# Patient Record
Sex: Male | Born: 2000 | Race: White | Hispanic: No | Marital: Single | Smoking: Former smoker
Health system: Southern US, Community
[De-identification: ages and names within clinical notes are randomized; demographics above are authoritative.]

## PROBLEM LIST (undated history)

## (undated) DIAGNOSIS — R519 Headache, unspecified: Secondary | ICD-10-CM

## (undated) DIAGNOSIS — S80819A Abrasion, unspecified lower leg, initial encounter: Secondary | ICD-10-CM

## (undated) DIAGNOSIS — G8929 Other chronic pain: Secondary | ICD-10-CM

## (undated) DIAGNOSIS — J45909 Unspecified asthma, uncomplicated: Secondary | ICD-10-CM

## (undated) DIAGNOSIS — J353 Hypertrophy of tonsils with hypertrophy of adenoids: Secondary | ICD-10-CM

## (undated) DIAGNOSIS — Z8489 Family history of other specified conditions: Secondary | ICD-10-CM

## (undated) DIAGNOSIS — R454 Irritability and anger: Secondary | ICD-10-CM

## (undated) DIAGNOSIS — F419 Anxiety disorder, unspecified: Secondary | ICD-10-CM

## (undated) DIAGNOSIS — F32A Depression, unspecified: Secondary | ICD-10-CM

## (undated) HISTORY — DX: Headache, unspecified: R51.9

## (undated) HISTORY — DX: Unspecified asthma, uncomplicated: J45.909

## (undated) HISTORY — PX: ESOPHAGOGASTRODUODENOSCOPY: SHX1529

## (undated) HISTORY — DX: Anxiety disorder, unspecified: F41.9

## (undated) HISTORY — DX: Depression, unspecified: F32.A

## (undated) HISTORY — DX: Other chronic pain: G89.29

## (undated) HISTORY — PX: FRENULECTOMY, LINGUAL: SHX1681

---
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
## 2022-01-23 ENCOUNTER — Other Ambulatory Visit: Payer: Self-pay | Admitting: Family Medicine

## 2022-01-25 ENCOUNTER — Ambulatory Visit: Payer: BC Managed Care – PPO | Admitting: Family Medicine

## 2022-01-26 NOTE — Telephone Encounter (Signed)
This medication is not covered It says use one of the preferred alternatives Unfortunately I have no idea what the preferred alternatives are  So if I am provided the preferred alternatives via a phone message I will be happy to act on this

## 2022-01-31 NOTE — Telephone Encounter (Signed)
Please inform mom that Zofran is no longer covered by his insurance they are recommending Phenergan but in my opinion Phenergan is way too strong.  Not quite sure what mom would like to do but Zofran will more than likely be out-of-pocket

## 2022-02-01 NOTE — Telephone Encounter (Signed)
Mom contacted and verbalized understanding. Mom states,if not to expensive, will pay out of pocket.

## 2022-02-14 ENCOUNTER — Encounter: Payer: Self-pay | Admitting: Gastroenterology

## 2022-02-14 ENCOUNTER — Encounter: Payer: Self-pay | Admitting: Family Medicine

## 2022-02-14 IMAGING — DX DG CHEST 2V
2 series · 2 of 2 positions shown · non-contrast
Comparison: 12/01/2011

CLINICAL DATA: Cough, fever, shortness of breath

EXAM:
CHEST - 2 VIEW

[chest pa]
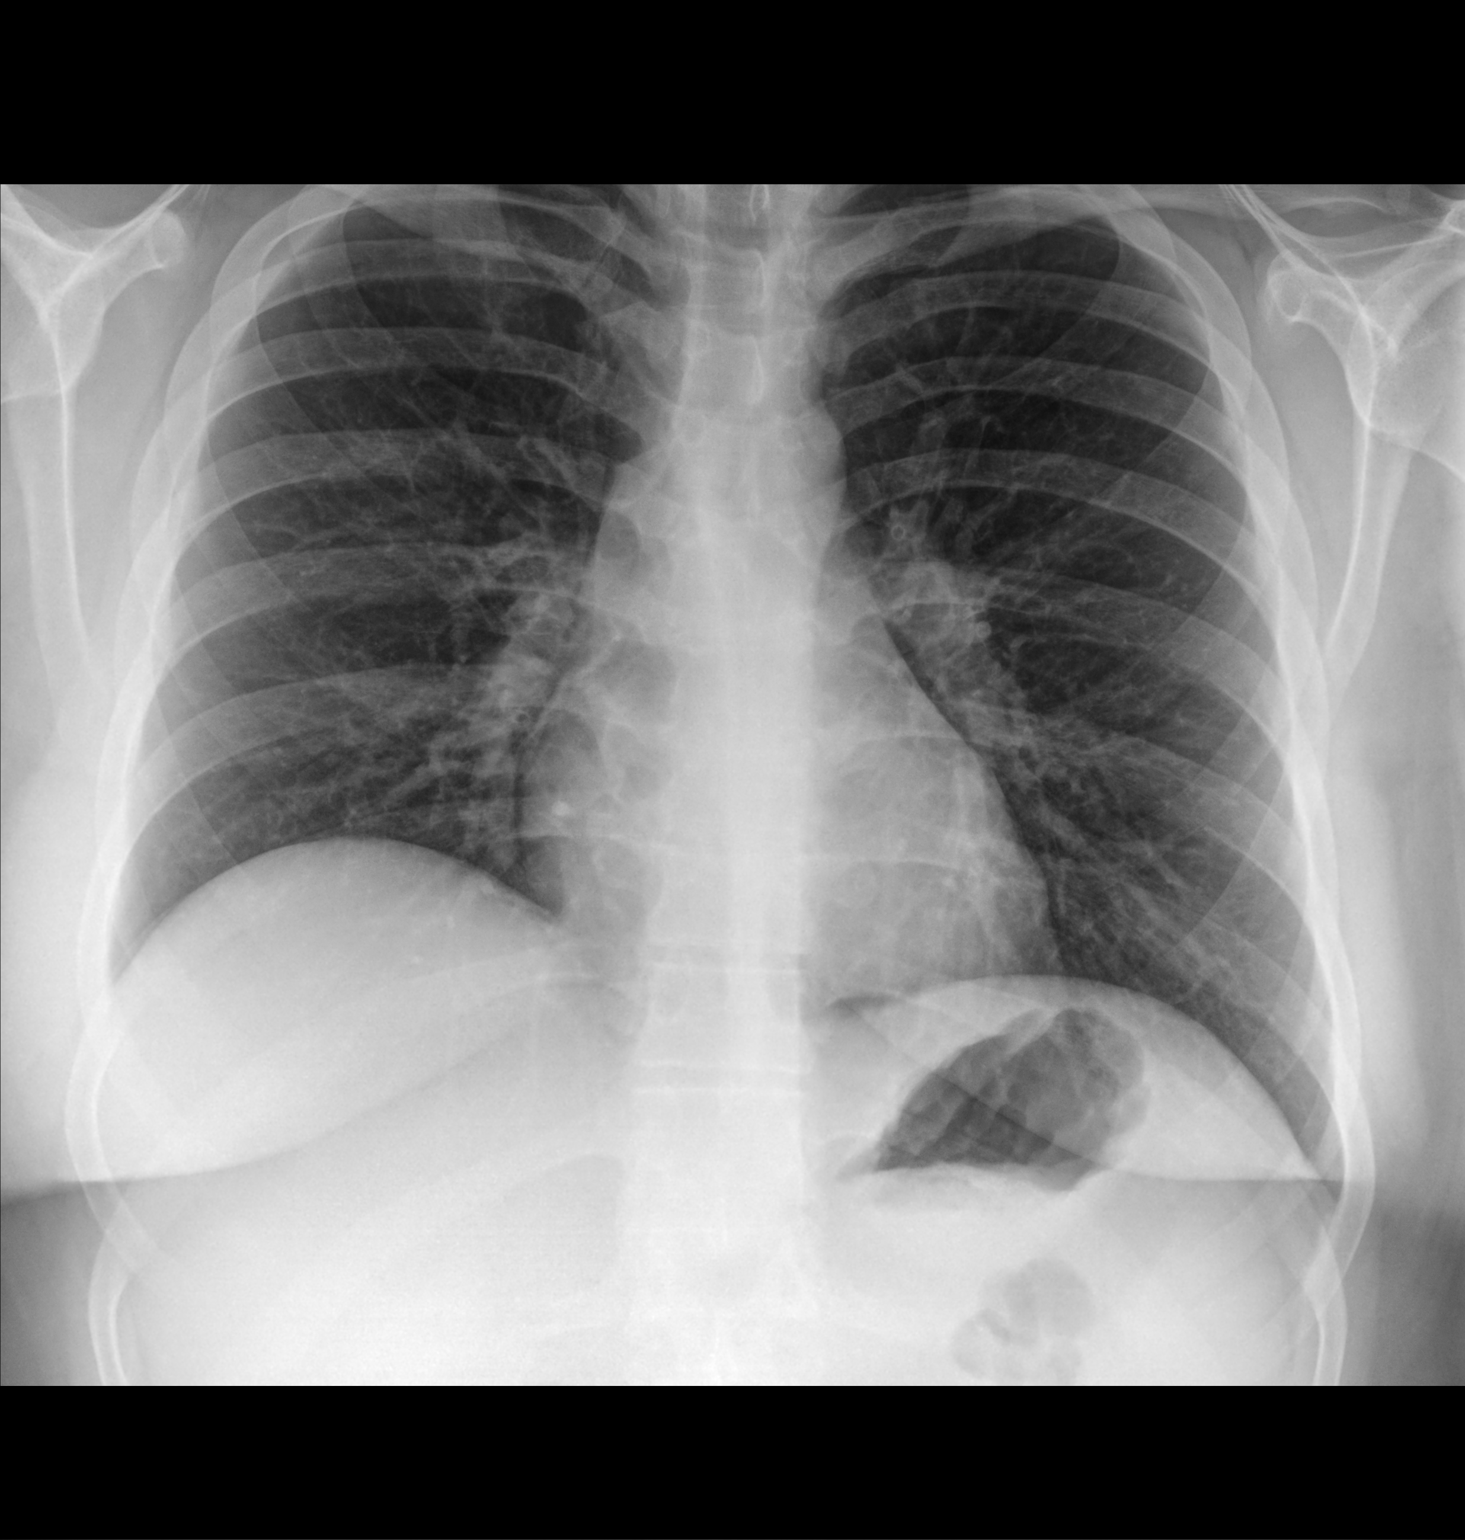

[chest lat]
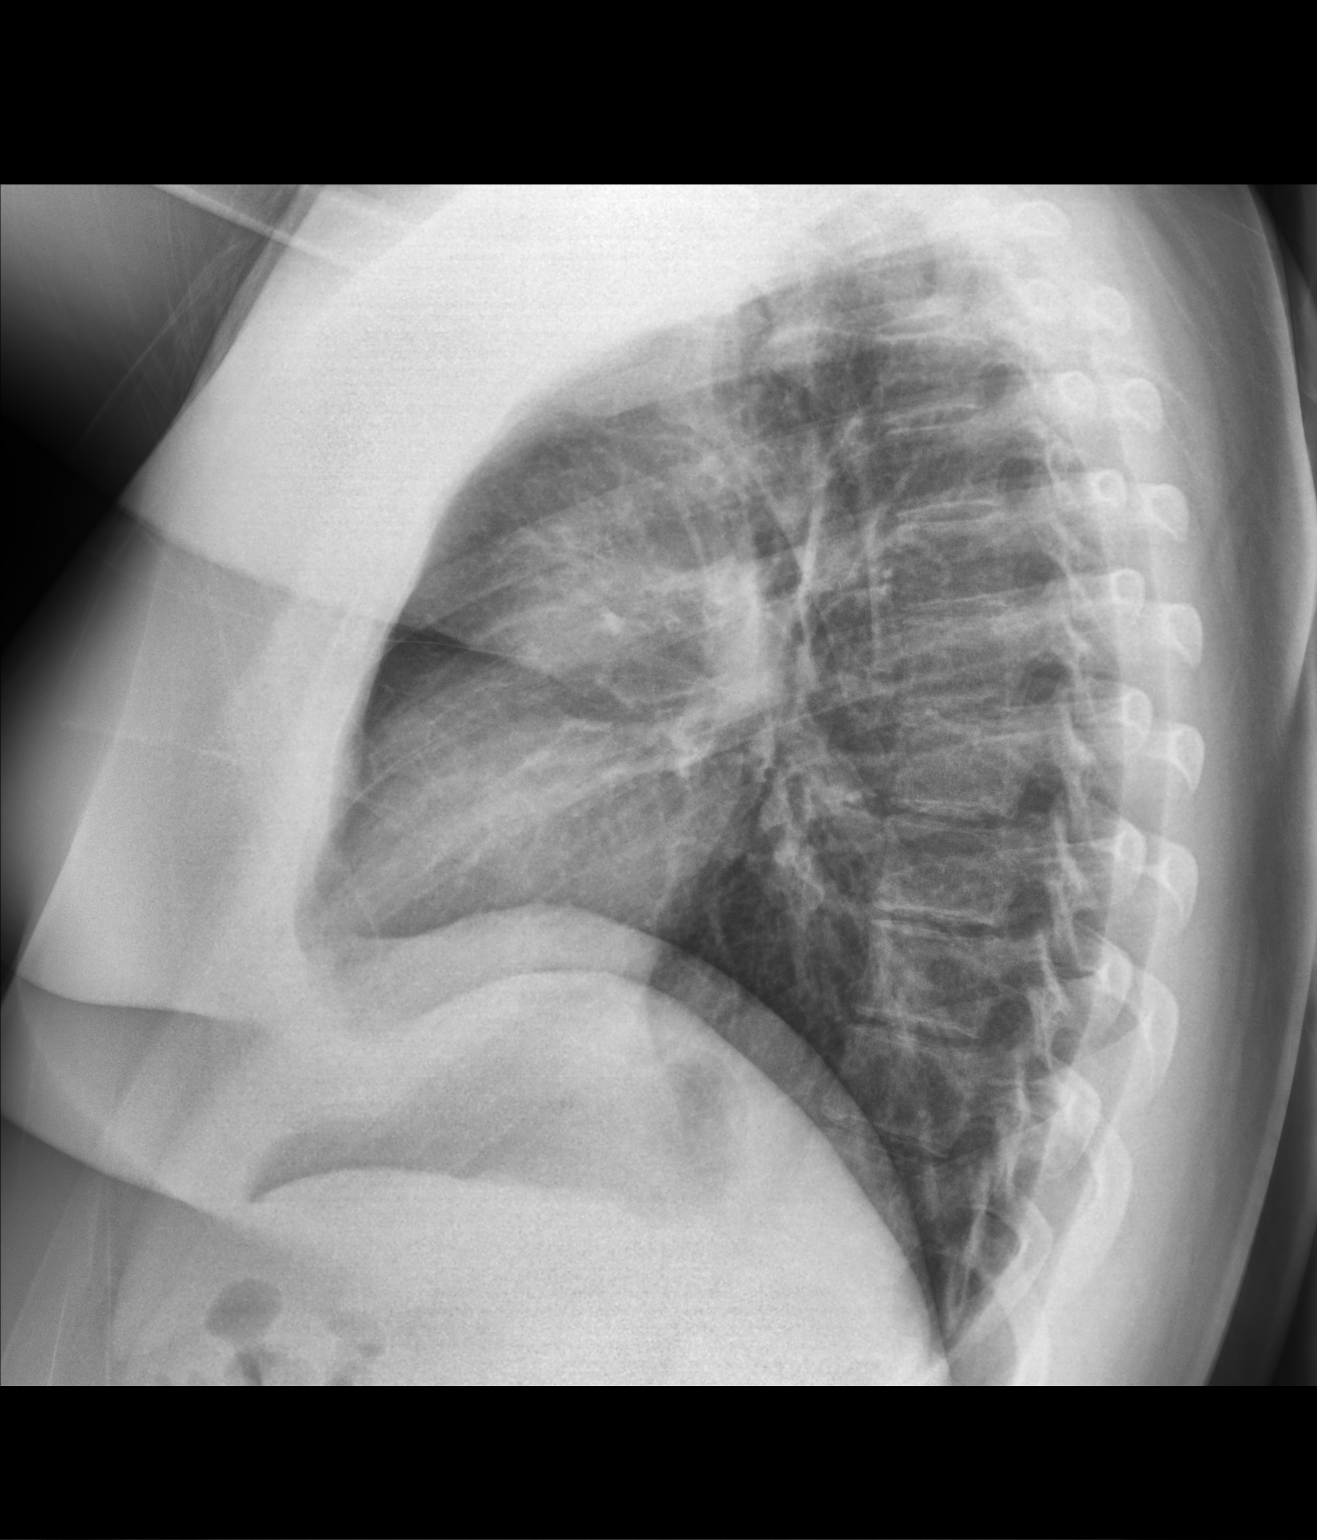

[2 of 2 positions shown; findings below may reference images not displayed]

FINDINGS: The heart size and mediastinal contours are within normal limits.
Both lungs are clear. The visualized skeletal structures are
unremarkable.
IMPRESSION: No acute abnormality of the lungs.

## 2022-02-15 NOTE — Telephone Encounter (Signed)
Nurses please go ahead and order pulmonary function testing due to dyspnea thank you

## 2022-02-22 ENCOUNTER — Encounter: Payer: Self-pay | Admitting: Gastroenterology

## 2022-02-22 ENCOUNTER — Ambulatory Visit: Payer: BC Managed Care – PPO | Admitting: Gastroenterology

## 2022-02-22 VITALS — BP 120/78 | HR 105 | Wt 224.4 lb

## 2022-02-22 DIAGNOSIS — R634 Abnormal weight loss: Secondary | ICD-10-CM | POA: Diagnosis not present

## 2022-02-22 DIAGNOSIS — R110 Nausea: Secondary | ICD-10-CM | POA: Diagnosis not present

## 2022-02-22 NOTE — Progress Notes (Unsigned)
Referring Provider: Babs Sciara, MD Primary Care Physician:  Babs Sciara, MD   Reason for Consultation:  Nausea   IMPRESSION:  Nausea Altered bowel habits  PLAN: Continue pantoprazole 40 mg QD Add famotidine 20 mg BID EGD Discussed MJ   HPI: Duane Fleming is a 21 y.o. male referred by Dr. Gerda Diss.  He has bad anxiety.   He lost 25 pounds over the month due to nausea, vomiting, and diffuse abdominal pain. Eating exacerbates his symptoms. He has lost his appetite.  He feels like can't urinate or defecate 10-12 months ago. Stools are soft. There is significant straining and a sense of incomplete evacuation.   Seen at [facility] for similar symptoms in high school. He was vomiting prior to school. He is doing online school for over a year.  Ultrasound showed a fatty liver. Liver enzymes were elevated. They recommended an endoscopy.  He smoking nicotene daily. Uses marijuana every hour to control his symptoms.   He started working out this week and he thinks that is helping.   His sister has EOE and colon ulcers and a fear of vomiting. She is followed by the pediatric gastroenterologist.   There is no known family history of colon cancer or polyps. No family history of stomach cancer or other GI malignancy. No family history of inflammatory bowel disease or celiac.    Past Medical History:  Diagnosis Date   Abrasion of leg 10/23/2016   Anxiety    Asthma    Chronic headache    Depression    Difficulty controlling anger    Family history of adverse reaction to anesthesia    maternal grandmother has issues with anxiety when waking from anesthesia   Tonsillar and adenoid hypertrophy 10/2016   mother unsure of apnea/snoring during sleep       Current Outpatient Medications  Medication Sig Dispense Refill   albuterol (VENTOLIN HFA) 108 (90 Base) MCG/ACT inhaler Inhale 2 puffs into the lungs every 6 (six) hours as needed for wheezing or shortness of breath. 18 g 1    carbamide peroxide (DEBROX) 6.5 % OTIC solution Place 5 drops into both ears 2 (two) times daily as needed. (Patient not taking: Reported on 12/14/2021) 15 mL 0   dicyclomine (BENTYL) 10 MG capsule Take one tid prn abdominal discomfort (Patient not taking: Reported on 12/14/2021) 21 capsule 1   levocetirizine (XYZAL) 5 MG tablet Take 1 tablet (5 mg total) by mouth every evening. (Patient not taking: Reported on 12/14/2021) 90 tablet 0   ondansetron (ZOFRAN) 8 MG tablet Take 0.5 tablets (4 mg total) by mouth every 8 (eight) hours as needed for nausea or vomiting (Patient not taking: Reported on 02/22/2022) 15 tablet 0   pantoprazole (PROTONIX) 40 MG tablet Take 1 tablet (40 mg total) by mouth daily. (Patient not taking: Reported on 02/22/2022) 30 tablet 3   No current facility-administered medications for this visit.    Allergies as of 02/22/2022 - Review Complete 02/22/2022  Allergen Reaction Noted   Keflex [cephalexin] Rash 12/15/2015    Family History  Problem Relation Age of Onset   Asthma Sister    Hypertension Maternal Grandfather    Hypertension Maternal Grandmother    Anesthesia problems Maternal Grandmother        anxiety when waking from anesthesia    Social History   Socioeconomic History   Marital status: Single    Spouse name: Not on file   Number of children: 0   Years  of education: Not on file   Highest education level: Not on file  Occupational History   Occupation: full time student  Tobacco Use   Smoking status: Never   Smokeless tobacco: Never  Vaping Use   Vaping Use: Never used  Substance and Sexual Activity   Alcohol use: No   Drug use: No   Sexual activity: Not on file  Other Topics Concern   Not on file  Social History Narrative   Not on file   Social Determinants of Health   Financial Resource Strain: Not on file  Food Insecurity: Not on file  Transportation Needs: Not on file  Physical Activity: Not on file  Stress: Not on file  Social  Connections: Not on file  Intimate Partner Violence: Not on file    Review of Systems: 12 system ROS is negative except as noted above.   Physical Exam: General:   Alert,  well-nourished, pleasant and cooperative in NAD Head:  Normocephalic and atraumatic. Eyes:  Sclera clear, no icterus.   Conjunctiva pink. Ears:  Normal auditory acuity. Nose:  No deformity, discharge,  or lesions. Mouth:  No deformity or lesions.   Neck:  Supple; no masses or thyromegaly. Lungs:  Clear throughout to auscultation.   No wheezes. Heart:  Regular rate and rhythm; no murmurs. Abdomen:  Soft, nontender, nondistended, normal bowel sounds, no rebound or guarding. No hepatosplenomegaly.   Rectal:  Deferred  Msk:  Symmetrical. No boney deformities LAD: No inguinal or umbilical LAD Extremities:  No clubbing or edema. Neurologic:  Alert and  oriented x4;  grossly nonfocal Skin:  Intact without significant lesions or rashes. Psych:  Alert and cooperative. Normal mood and affect.   Ivo Moga L. Orvan Falconer, MD, MPH 02/22/2022, 10:19 AM

## 2022-02-22 NOTE — Patient Instructions (Addendum)
We discussed an upper endoscopy for further evaluation.  I recommend pantoprazole 40 mg taken every morning for at least 8 weeks.   We discussed strategies to try to minimize your symptoms.  Avoid any foods that make you feel nauseated. This may include spicy, strong-smelling, and high fat foods. You might find cold, bland foods easier to eat without feeling nauseated.  Try using ginger products such as tea, ginger tablets, or ginger ale to help with the nausea.  Eat frequent, small, high calorie meals and snacks. Hunger can make the feelings of nausea stronger.  Sit upright or recline with head elevated for at least 30-60 minutes after meals.   Good oral hygiene with frequent tooth brushing can help reduce unpleasant mouth tastes contributing to nausea.  Apply a cool damp cloth on your neck or forehead if you are very nauseous.   Relaxation, imagery, acupressure, and acupuncture may all provide some relief. Keep an open mind and try them.    I'd like to see you back in the office after 8 weeks if you aren't going to have the endoscopy.

## 2022-02-23 ENCOUNTER — Ambulatory Visit (AMBULATORY_SURGERY_CENTER): Payer: BC Managed Care – PPO | Admitting: Gastroenterology

## 2022-02-23 ENCOUNTER — Encounter: Payer: Self-pay | Admitting: Gastroenterology

## 2022-02-23 VITALS — BP 109/62 | HR 75 | Temp 98.4°F | Resp 20 | Ht 71.0 in | Wt 224.0 lb

## 2022-02-23 DIAGNOSIS — R110 Nausea: Secondary | ICD-10-CM

## 2022-02-23 DIAGNOSIS — K2100 Gastro-esophageal reflux disease with esophagitis, without bleeding: Secondary | ICD-10-CM | POA: Diagnosis not present

## 2022-02-23 DIAGNOSIS — K2950 Unspecified chronic gastritis without bleeding: Secondary | ICD-10-CM

## 2022-02-23 DIAGNOSIS — R112 Nausea with vomiting, unspecified: Secondary | ICD-10-CM | POA: Diagnosis not present

## 2022-02-23 MED ORDER — SODIUM CHLORIDE 0.9 % IV SOLN: 500.0000 mL | Freq: Once | INTRAVENOUS | Status: DC

## 2022-02-23 NOTE — Patient Instructions (Signed)
Await pathology results from the biopsies taken today.  Resume previous diet and medications.  Continue pantoprazole 40 mg every morning and famotidine 20 mg twice a day.   YOU HAD AN ENDOSCOPIC PROCEDURE TODAY AT THE [facility]:   Refer to the procedure report that was given to you for any specific questions about what was found during the examination.  If the procedure report does not answer your questions, please call your gastroenterologist to clarify.  If you requested that your care partner not be given the details of your procedure findings, then the procedure report has been included in a sealed envelope for you to review at your convenience later.  YOU SHOULD EXPECT: Some feelings of bloating in the abdomen. Passage of more gas than usual.  Walking can help get rid of the air that was put into your GI tract during the procedure and reduce the bloating. If you had a lower endoscopy (such as a colonoscopy or flexible sigmoidoscopy) you may notice spotting of blood in your stool or on the toilet paper. If you underwent a bowel prep for your procedure, you may not have a normal bowel movement for a few days.  Please Note:  You might notice some irritation and congestion in your nose or some drainage.  This is from the oxygen used during your procedure.  There is no need for concern and it should clear up in a day or so.  SYMPTOMS TO REPORT IMMEDIATELY:   Following upper endoscopy (EGD)  Vomiting of blood or coffee ground material  New chest pain or pain under the shoulder blades  Painful or persistently difficult swallowing  New shortness of breath  Fever of 100F or higher  Black, tarry-looking stools  For urgent or emergent issues, a gastroenterologist can be reached at any hour by calling [phone]. Do not use MyChart messaging for urgent concerns.    DIET:  We do recommend a small meal at first, but then you may proceed to your regular diet.  Drink plenty of  fluids but you should avoid alcoholic beverages for 24 hours.  ACTIVITY:  You should plan to take it easy for the rest of today and you should NOT DRIVE or use heavy machinery until tomorrow (because of the sedation medicines used during the test).    FOLLOW UP: Our staff will call the number listed on your records the next business day following your procedure.  We will call around 7:15- 8:00 am to check on you and address any questions or concerns that you may have regarding the information given to you following your procedure. If we do not reach you, we will leave a message.     If any biopsies were taken you will be contacted by phone or by letter within the next 1-3 weeks.  Please call us at [phone] if you have not heard about the biopsies in 3 weeks.    SIGNATURES/CONFIDENTIALITY: You and/or your care partner have signed paperwork which will be entered into your electronic medical record.  These signatures attest to the fact that that the information above on your After Visit Summary has been reviewed and is understood.  Full responsibility of the confidentiality of this discharge information lies with you and/or your care-partner.

## 2022-02-23 NOTE — Progress Notes (Signed)
Indication for procedure: Nausea, vomiting, 25 pound weight loss, anorexia  Please see my 02/22/2022 office note for complete details.  There is been no significant change in history or physical exam since that time.  The patient remains an appropriate candidate for monitored anesthesia care in the endoscopy center.

## 2022-02-23 NOTE — Progress Notes (Signed)
Called to room to assist during endoscopic procedure.  Patient ID and intended procedure confirmed with present staff. Received instructions for my participation in the procedure from the performing physician.  

## 2022-02-23 NOTE — Op Note (Signed)
[facility] Patient Name: Duane Fleming Procedure Date: 02/23/2022 3:12 PM MRN: [id number] Endoscopist: Tressia Danas MD, MD, [id number] Age: 21 Referring MD:  Date of Birth: 06/15/2000 Gender: Male Account #: [id number] Procedure:                Upper GI endoscopy Indications:              Nausea with vomiting, 25 pound weight loss Medicines:                Monitored Anesthesia Care Procedure:                Pre-Anesthesia Assessment:                           - Prior to the procedure, a History and Physical                            was performed, and patient medications and                            allergies were reviewed. The patient's tolerance of                            previous anesthesia was also reviewed. The risks                            and benefits of the procedure and the sedation                            options and risks were discussed with the patient.                            All questions were answered, and informed consent                            was obtained. Prior Anticoagulants: The patient has                            taken no anticoagulant or antiplatelet agents. ASA                            Grade Assessment: II - A patient with mild systemic                            disease. After reviewing the risks and benefits,                            the patient was deemed in satisfactory condition to                            undergo the procedure.                           After obtaining informed consent, the endoscope was  passed under direct vision. Throughout the                            procedure, the patient's blood pressure, pulse, and                            oxygen saturations were monitored continuously. The                            GIF [id number] #[id number] was introduced through the                            mouth, and advanced to the third part of duodenum.                            The  upper GI endoscopy was accomplished without                            difficulty. The patient tolerated the procedure                            well. Scope In: Scope Out: Findings:                 The examined esophagus was normal. Biopsies were                            obtained from the mid/proximal and distal esophagus                            with cold forceps for histology of suspected                            eosinophilic esophagitis.                           Patchy minimal inflammation characterized by                            granularity was found in the gastric body. Biopsies                            were taken from the antrum, body, and fundus with a                            cold forceps for histology. Estimated blood loss                            was minimal.                           The examined duodenum was normal. Biopsies were                            taken with a cold forceps for histology. Estimated  blood loss was minimal.                           The cardia and gastric fundus were normal on                            retroflexion.                           The exam was otherwise without abnormality. Complications:            No immediate complications. Estimated Blood Loss:     Estimated blood loss was minimal. Impression:               - Normal esophagus.                           - Gastritis. Biopsied.                           - Normal examined duodenum. Biopsied.                           - The examination was otherwise normal.                           - Biopsies were taken with a cold forceps for                            evaluation of eosinophilic esophagitis. Recommendation:           - Patient has a contact number available for                            emergencies. The signs and symptoms of potential                            delayed complications were discussed with the                            patient.  Return to normal activities tomorrow.                            Written discharge instructions were provided to the                            patient.                           - Resume previous diet.                           - Continue present medications.                           - Continue pantoprazole 40 mg every morning and  famotidine 20 mg twice daily.                           - Await pathology results. Thornton Park MD, MD 02/23/2022 3:35:40 PM This report has been signed electronically.

## 2022-02-23 NOTE — Progress Notes (Signed)
Pt resting comfortably. VSS. Airway intact. SBAR complete to RN. All questions answered.   

## 2022-02-24 ENCOUNTER — Telehealth: Payer: Self-pay

## 2022-02-24 NOTE — Telephone Encounter (Signed)
  Follow up Call-     02/23/2022    2:32 PM  Call back number  Post procedure Call Back phone  # [phone]  Permission to leave phone message Yes     Patient questions:  Do you have a fever, pain , or abdominal swelling? No. Pain Score  0 *  Have you tolerated food without any problems? Yes.    Have you been able to return to your normal activities? Yes.    Do you have any questions about your discharge instructions: Diet   No. Medications  No. Follow up visit  No.  Do you have questions or concerns about your Care? No.  Actions: * If pain score is 4 or above: No action needed, pain <4.  States not eating yet but had issues before, will see how he does when he wakes up today and call if any concerns.

## 2022-03-01 ENCOUNTER — Other Ambulatory Visit: Payer: Self-pay

## 2022-03-01 MED ORDER — PANTOPRAZOLE SODIUM 40 MG PO TBEC: 40.0000 mg | DELAYED_RELEASE_TABLET | Freq: Every day | ORAL | 3 refills | Status: DC

## 2022-03-01 MED ORDER — PANTOPRAZOLE SODIUM 40 MG PO TBEC: 40.0000 mg | DELAYED_RELEASE_TABLET | Freq: Two times a day (BID) | ORAL | 1 refills | Status: DC

## 2022-03-25 ENCOUNTER — Other Ambulatory Visit: Payer: Self-pay | Admitting: Gastroenterology

## 2022-03-31 ENCOUNTER — Ambulatory Visit: Payer: BC Managed Care – PPO | Admitting: Physician Assistant

## 2022-04-12 ENCOUNTER — Ambulatory Visit: Payer: BC Managed Care – PPO | Admitting: Physician Assistant

## 2022-07-11 ENCOUNTER — Ambulatory Visit: Payer: No Typology Code available for payment source | Admitting: Family Medicine

## 2022-07-11 VITALS — BP 122/82 | HR 87 | Temp 99.1°F | Ht 71.5 in | Wt 202.0 lb

## 2022-07-11 DIAGNOSIS — H6122 Impacted cerumen, left ear: Secondary | ICD-10-CM

## 2022-07-11 MED ORDER — ONDANSETRON HCL 8 MG PO TABS: ORAL_TABLET | ORAL | 4 refills | Status: DC

## 2022-07-11 NOTE — Progress Notes (Signed)
   Subjective:    Patient ID: Duane Fleming, male    DOB: 2001-01-30, 22 y.o.   MRN: [id number]  HPI  Left ear throbbing pain, ringing, difficult hearing 2 to 3 days Request for zofran refill   Review of Systems     Objective:   Physical Exam Cerumen impaction noted on the left side no sign of infection       Assessment & Plan:   To use Colace drops in the ear to follow-up on Thursday to have the ear irrigated

## 2022-07-13 ENCOUNTER — Ambulatory Visit (INDEPENDENT_AMBULATORY_CARE_PROVIDER_SITE_OTHER): Payer: No Typology Code available for payment source | Admitting: Family Medicine

## 2022-07-13 DIAGNOSIS — H6122 Impacted cerumen, left ear: Secondary | ICD-10-CM | POA: Diagnosis not present

## 2022-07-13 MED ORDER — AMOXICILLIN-POT CLAVULANATE 875-125 MG PO TABS: 1.0000 | ORAL_TABLET | Freq: Two times a day (BID) | ORAL | 0 refills | Status: DC

## 2022-07-13 NOTE — Progress Notes (Signed)
Here today for ear irrigation Both ears were irrigated with good success He was also complaining of left ear pain On visual exam he does have a left otitis media He was treated with antibiotics and told to follow-up if any ongoing troubles

## 2022-08-14 ENCOUNTER — Other Ambulatory Visit: Payer: Self-pay

## 2022-08-14 ENCOUNTER — Ambulatory Visit (INDEPENDENT_AMBULATORY_CARE_PROVIDER_SITE_OTHER): Payer: No Typology Code available for payment source | Admitting: Family Medicine

## 2022-08-14 ENCOUNTER — Emergency Department (HOSPITAL_COMMUNITY): Payer: PRIVATE HEALTH INSURANCE

## 2022-08-14 ENCOUNTER — Emergency Department (HOSPITAL_COMMUNITY)
Admission: EM | Admit: 2022-08-14 | Discharge: 2022-08-14 | Disposition: A | Discharge status: Home / Self Care | Payer: PRIVATE HEALTH INSURANCE | Attending: Emergency Medicine | Admitting: Emergency Medicine

## 2022-08-14 ENCOUNTER — Encounter (HOSPITAL_COMMUNITY): Payer: Self-pay | Admitting: *Deleted

## 2022-08-14 VITALS — BP 132/91 | HR 110 | Temp 97.3°F | Ht 71.5 in | Wt 190.0 lb

## 2022-08-14 DIAGNOSIS — E876 Hypokalemia: Secondary | ICD-10-CM | POA: Insufficient documentation

## 2022-08-14 DIAGNOSIS — J45909 Unspecified asthma, uncomplicated: Secondary | ICD-10-CM | POA: Insufficient documentation

## 2022-08-14 DIAGNOSIS — R112 Nausea with vomiting, unspecified: Secondary | ICD-10-CM | POA: Insufficient documentation

## 2022-08-14 LAB — CBC WITH DIFFERENTIAL/PLATELET
Abs Immature Granulocytes: 0.02 10*3/uL (ref 0.00–0.07)
Basophils Absolute: 0 10*3/uL (ref 0.0–0.1)
Basophils Relative: 0 %
Eosinophils Absolute: 0 10*3/uL (ref 0.0–0.5)
Eosinophils Relative: 0 %
HCT: 46.7 % (ref 39.0–52.0)
Hemoglobin: 16.7 g/dL (ref 13.0–17.0)
Immature Granulocytes: 0 %
Lymphocytes Relative: 19 %
Lymphs Abs: 1.6 10*3/uL (ref 0.7–4.0)
MCH: 29.9 pg (ref 26.0–34.0)
MCHC: 35.8 g/dL (ref 30.0–36.0)
MCV: 83.5 fL (ref 80.0–100.0)
Monocytes Absolute: 0.4 10*3/uL (ref 0.1–1.0)
Monocytes Relative: 5 %
Neutro Abs: 6.2 10*3/uL (ref 1.7–7.7)
Neutrophils Relative %: 76 %
Platelets: 329 10*3/uL (ref 150–400)
RBC: 5.59 MIL/uL (ref 4.22–5.81)
RDW: 11.9 % (ref 11.5–15.5)
WBC: 8.3 10*3/uL (ref 4.0–10.5)
nRBC: 0 % (ref 0.0–0.2)

## 2022-08-14 LAB — COMPREHENSIVE METABOLIC PANEL
ALT: 31 U/L (ref 0–44)
AST: 20 U/L (ref 15–41)
Albumin: 5.2 g/dL — ABNORMAL HIGH (ref 3.5–5.0)
Alkaline Phosphatase: 60 U/L (ref 38–126)
Anion gap: 14 (ref 5–15)
BUN: 8 mg/dL (ref 6–20)
CO2: 22 mmol/L (ref 22–32)
Calcium: 10 mg/dL (ref 8.9–10.3)
Chloride: 103 mmol/L (ref 98–111)
Creatinine, Ser: 0.78 mg/dL (ref 0.61–1.24)
GFR, Estimated: >60 mL/min (ref >60)
Glucose, Bld: 87 mg/dL (ref 70–99)
Potassium: 3 mmol/L — ABNORMAL LOW (ref 3.5–5.1)
Sodium: 139 mmol/L (ref 135–145)
Total Bilirubin: 1.3 mg/dL — ABNORMAL HIGH (ref 0.3–1.2)
Total Protein: 8.5 g/dL — ABNORMAL HIGH (ref 6.5–8.1)

## 2022-08-14 LAB — URINALYSIS, ROUTINE W REFLEX MICROSCOPIC
Bacteria, UA: NONE SEEN
Bilirubin Urine: NEGATIVE
Glucose, UA: NEGATIVE mg/dL
Hgb urine dipstick: NEGATIVE
Ketones, ur: 80 mg/dL — AB
Leukocytes,Ua: NEGATIVE
Nitrite: NEGATIVE
Protein, ur: 30 mg/dL — AB
Specific Gravity, Urine: 1.023 (ref 1.005–1.030)
pH: 5 (ref 5.0–8.0)

## 2022-08-14 LAB — LIPASE, BLOOD: Lipase: 29 U/L (ref 11–51)

## 2022-08-14 MED ORDER — LORAZEPAM 0.5 MG PO TABS
0.5000 mg | ORAL_TABLET | Freq: Once | ORAL | Status: AC
  Administered 2022-08-14: 0.5 mg via ORAL
  Filled 2022-08-14: qty 1

## 2022-08-14 MED ORDER — POTASSIUM CHLORIDE CRYS ER 20 MEQ PO TBCR: 20.0000 meq | EXTENDED_RELEASE_TABLET | Freq: Two times a day (BID) | ORAL | 0 refills | Status: DC

## 2022-08-14 MED ORDER — ONDANSETRON HCL 4 MG/2ML IJ SOLN
4.0000 mg | Freq: Once | INTRAMUSCULAR | Status: AC
  Administered 2022-08-14: 4 mg via INTRAVENOUS
  Filled 2022-08-14: qty 2

## 2022-08-14 MED ORDER — FENTANYL CITRATE PF 50 MCG/ML IJ SOSY
25.0000 ug | PREFILLED_SYRINGE | Freq: Once | INTRAMUSCULAR | Status: AC
  Administered 2022-08-14: 25 ug via INTRAVENOUS
  Filled 2022-08-14: qty 1

## 2022-08-14 MED ORDER — POTASSIUM CHLORIDE 10 MEQ/100ML IV SOLN
10.0000 meq | INTRAVENOUS | Status: AC
  Administered 2022-08-14 (×2): 10 meq via INTRAVENOUS
  Filled 2022-08-14 (×2): qty 100

## 2022-08-14 MED ORDER — IOHEXOL 300 MG/ML  SOLN
100.0000 mL | Freq: Once | INTRAMUSCULAR | Status: AC | PRN
  Administered 2022-08-14: 100 mL via INTRAVENOUS

## 2022-08-14 MED ORDER — ONDANSETRON 8 MG PO TBDP: 8.0000 mg | ORAL_TABLET | Freq: Three times a day (TID) | ORAL | 3 refills | Status: DC | PRN

## 2022-08-14 MED ORDER — SODIUM CHLORIDE 0.9 % IV BOLUS
1000.0000 mL | Freq: Once | INTRAVENOUS | Status: AC
  Administered 2022-08-14: 1000 mL via INTRAVENOUS

## 2022-08-14 MED ORDER — ONDANSETRON HCL 4 MG PO TABS: 4.0000 mg | ORAL_TABLET | Freq: Four times a day (QID) | ORAL | 0 refills | Status: DC

## 2022-08-14 MED ORDER — HALOPERIDOL LACTATE 5 MG/ML IJ SOLN
1.0000 mg | Freq: Once | INTRAMUSCULAR | Status: AC
  Administered 2022-08-14: 1 mg via INTRAVENOUS
  Filled 2022-08-14: qty 1

## 2022-08-14 NOTE — Patient Instructions (Signed)
Go to the ER.  Schedule a follow up with Dr. Lorin Picket.  Take care  Dr. Adriana Simas

## 2022-08-14 NOTE — Assessment & Plan Note (Signed)
Patient heaving and vomiting throughout the exam.  Overall ill-appearing.  Advised the patient that he needs labs, IV fluids, and may need imaging of the abdomen.  I do not have the capability to provide IV fluids in the clinic.  We also do not have any injectable medication regarding nausea and vomiting.  Sending directly to the ED for labs, IV fluids, and imaging.  I have advised the patient that he should follow-up with Dr. Gerda Diss.  Patient asked me to refill his Zofran and I did so.

## 2022-08-14 NOTE — Progress Notes (Signed)
Subjective:  Patient ID: Duane Fleming, male    DOB: Apr 14, 2000  Age: 22 y.o. MRN: [id number]  CC: Chief Complaint  Patient presents with   Emesis    Nausea - hx of passing out 3/30  Severe stabbing ruq pain, temp 96.3 , feeling lightheaded , sweating, ,cold, lost 5 lbs in 5 days, trouble digesting, and watery stool- green/ brown , constipation, trouble peeing    HPI:  22 year old male with recurrent nausea and vomiting presents for evaluation of nausea and vomiting.  Patient has had symptoms for quite some time.  Per the EMR has had significant symptoms since May 2023.  Has been seen by GI as of last year.  Had an endoscopy which was essentially unrevealing.  Pathology revealed chronic esophagitis and gastritis as well as reflux changes.  No evidence of eosinophilic esophagitis.  Patient has had ongoing substantial weight loss which she states has been unintentional.  Over the past 5 days his symptoms have been intractable.  He has had intractable nausea and vomiting.  He has been able to keep some fluids down but nothing else.  He reports associated abdominal pain.  Patient feels lightheaded and as if he is going to pass out.  He feels shaky as well.  No documented fever.  He has been using Zofran and Protonix without relief.  When asked about marijuana use, patient states that he uses marijuana every day.  I have advised him that this could be the cause of his frequent nausea and vomiting.  He does not agree.  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   Obesity 11/26/2014   Asthma, chronic 02/17/2013    Social Hx   Social History   Socioeconomic History   Marital status: Single    Spouse name: Not on file   Number of children: 0   Years of education: Not on file   Highest education level: Not on file   Occupational History   Occupation: full time student  Tobacco Use   Smoking status: Never   Smokeless tobacco: Never  Vaping Use   Vaping Use: Every day  Substance and Sexual Activity   Alcohol use: No   Drug use: No   Sexual activity: Not on file  Other Topics Concern   Not on file  Social History Narrative   Not on file   Social Determinants of Health   Financial Resource Strain: Not on file  Food Insecurity: Not on file  Transportation Needs: Not on file  Physical Activity: Not on file  Stress: Not on file  Social Connections: Not on file    Review of Systems Per HPI  Objective:  BP (!) 132/91   Pulse (!) 110   Temp (!) 97.3 F (36.3 C)   Ht 5' 11.5" (1.816 m)   Wt 190 lb (86.2 kg)   SpO2 99%   BMI 26.13 kg/m      08/14/2022    9:30 PM 08/14/2022    9:00 PM 08/14/2022    8:33 PM  BP/Weight  Systolic BP 126 138 136  Diastolic BP 77 69 90    Physical Exam Constitutional:      Comments: Ill-appearing. Patient is shaking vigorously during exam.  Eyes:     General:        Right eye: No discharge.  Left eye: No discharge.     Conjunctiva/sclera: Conjunctivae normal.  Cardiovascular:     Rate and Rhythm: Regular rhythm. Tachycardia present.  Abdominal:     General: There is no distension.     Palpations: Abdomen is soft.     Comments: No significant tenderness to palpation on exam.  Neurological:     Mental Status: He is alert.     Lab Results  Component Value Date   WBC 8.3 08/14/2022   HGB 16.7 08/14/2022   HCT 46.7 08/14/2022   PLT 329 08/14/2022   GLUCOSE 87 08/14/2022   CHOL 204 (H) 02/05/2020   TRIG 117 (H) 02/05/2020   HDL 56 02/05/2020   LDLCALC 127 (H) 02/05/2020   ALT 31 08/14/2022   AST 20 08/14/2022   NA 139 08/14/2022   K 3.0 (L) 08/14/2022   CL 103 08/14/2022   CREATININE 0.78 08/14/2022   BUN 8 08/14/2022   CO2 22 08/14/2022   HGBA1C 5.1 02/05/2020     Assessment & Plan:   Problem List Items Addressed This  Visit       Digestive   Intractable nausea and vomiting - Primary    Patient heaving and vomiting throughout the exam.  Overall ill-appearing.  Advised the patient that he needs labs, IV fluids, and may need imaging of the abdomen.  I do not have the capability to provide IV fluids in the clinic.  We also do not have any injectable medication regarding nausea and vomiting.  Sending directly to the ED for labs, IV fluids, and imaging.  I have advised the patient that he should follow-up with Dr. Gerda Diss.  Patient asked me to refill his Zofran and I did so.       Meds ordered this encounter  Medications   ondansetron (ZOFRAN-ODT) 8 MG disintegrating tablet    Sig: Take 1 tablet (8 mg total) by mouth every 8 (eight) hours as needed for nausea or vomiting.    Dispense:  20 tablet    Refill:  3    Gurneet Matarese DO [facility]

## 2022-08-14 NOTE — ED Triage Notes (Addendum)
Pt with emesis x 5 days per pt. + abd pain LBM a week ago. Recurrent per family member. Pt seen at PCP and sent here for possible CT. Pt states he feels like he is going to pass out.

## 2022-08-14 NOTE — Discharge Instructions (Signed)
Your potassium level this evening was low.  You have being given potassium here as well as a prescription to take for several days.  Please follow-up with your primary care provider to have your potassium level rechecked in 1 week.  Frequent small sips of fluids tonight, bland diet as tolerated starting tomorrow.  Return to the emergency department for any new or worsening symptoms

## 2022-08-14 NOTE — ED Provider Notes (Signed)
[place] EMERGENCY DEPARTMENT AT [facility] Provider Note   CSN: [id number] Arrival date & time: 08/14/22  1624     History  Chief Complaint  Patient presents with   Emesis    Dail A Strosnider is a 22 y.o. male.   Emesis Associated symptoms: abdominal pain   Associated symptoms: no arthralgias, no chills, no fever and no headaches       Sipriano A Collin is a 22 y.o. male with past medical history of anxiety, chronic headaches, and asthma who presents to the Emergency Department complaining of persistent nausea and vomiting x 5 days.  Describes having of diffuse abdominal pain as well.  Last significant bowel movement was 1 week ago but states he passed a small amount of stool earlier today.  Reports feeling dizzy and "feels like I am going to pass out."  Seen by PCP earlier today and sent here for further evaluation.  Denies any fever, chills, dysuria chest pain or shortness of breath.    Home Medications Prior to Admission medications   Medication Sig Start Date End Date Taking? Authorizing Provider  albuterol (VENTOLIN HFA) 108 (90 Base) MCG/ACT inhaler Inhale 2 puffs into the lungs every 6 (six) hours as needed for wheezing or shortness of breath. Patient not taking: Reported on 08/14/2022 02/23/21   Mardella Layman, MD  levocetirizine (XYZAL) 5 MG tablet Take 1 tablet (5 mg total) by mouth every evening. Patient not taking: Reported on 08/14/2022 05/01/21   Wallis Bamberg, PA-C  ondansetron (ZOFRAN-ODT) 8 MG disintegrating tablet Take 1 tablet (8 mg total) by mouth every 8 (eight) hours as needed for nausea or vomiting. 08/14/22   Tommie Sams, DO  pantoprazole (PROTONIX) 40 MG tablet TAKE 1 TABLET (40 MG TOTAL) BY MOUTH TWICE A DAY BEFORE MEALS Patient not taking: Reported on 08/14/2022 03/27/22   Tressia Danas, MD      Allergies    Keflex [cephalexin]    Review of Systems   Review of Systems  Constitutional:  Positive for appetite change. Negative for chills and  fever.  HENT:  Negative for congestion.   Respiratory:  Negative for chest tightness and shortness of breath.   Cardiovascular:  Negative for chest pain.  Gastrointestinal:  Positive for abdominal pain, constipation, nausea and vomiting.  Genitourinary:  Negative for difficulty urinating and dysuria.  Musculoskeletal:  Negative for arthralgias, back pain, neck pain and neck stiffness.  Neurological:  Positive for weakness. Negative for dizziness, seizures, syncope and headaches.       "Feels like I'm going to pass out"    Physical Exam Updated Vital Signs BP 126/77   Pulse 60   Temp 98.3 F (36.8 C) (Oral)   Resp 17   SpO2 97%  Physical Exam Vitals and nursing note reviewed.  Constitutional:      General: He is not in acute distress.    Appearance: Normal appearance. He is not ill-appearing.  HENT:     Mouth/Throat:     Mouth: Mucous membranes are dry.  Cardiovascular:     Rate and Rhythm: Normal rate and regular rhythm.     Pulses: Normal pulses.  Pulmonary:     Effort: Pulmonary effort is normal.  Abdominal:     Palpations: Abdomen is soft.     Tenderness: There is abdominal tenderness. There is no right CVA tenderness or left CVA tenderness.     Comments: Diffuse tenderness to palpation of the abdomen.  No guarding or rebound tenderness.  Abdomen is soft  Musculoskeletal:        General: Normal range of motion.     Cervical back: Normal range of motion.  Lymphadenopathy:     Cervical: No cervical adenopathy.  Skin:    General: Skin is warm.     Capillary Refill: Capillary refill takes less than 2 seconds.  Neurological:     General: No focal deficit present.     Mental Status: He is alert.     Sensory: No sensory deficit.     Motor: No weakness.     ED Results / Procedures / Treatments   Labs (all labs ordered are listed, but only abnormal results are displayed) Labs Reviewed  COMPREHENSIVE METABOLIC PANEL - Abnormal; Notable for the following components:       Result Value   Potassium 3.0 (*)    Total Protein 8.5 (*)    Albumin 5.2 (*)    Total Bilirubin 1.3 (*)    All other components within normal limits  URINALYSIS, ROUTINE W REFLEX MICROSCOPIC - Abnormal; Notable for the following components:   Ketones, ur 80 (*)    Protein, ur 30 (*)    All other components within normal limits  CBC WITH DIFFERENTIAL/PLATELET  LIPASE, BLOOD    EKG None  Radiology CT ABDOMEN PELVIS W CONTRAST  Result Date: 08/14/2022 CLINICAL DATA:  Abdominal pain, acute, nonlocalized . Nausea, vomiting EXAM: CT ABDOMEN AND PELVIS WITH CONTRAST TECHNIQUE: Multidetector CT imaging of the abdomen and pelvis was performed using the standard protocol following bolus administration of intravenous contrast. RADIATION DOSE REDUCTION: This exam was performed according to the departmental dose-optimization program which includes automated exposure control, adjustment of the mA and/or kV according to patient size and/or use of iterative reconstruction technique. CONTRAST:  OMNIPAQUE IOHEXOL 300 MG/ML  SOLN COMPARISON:  None Available. FINDINGS: Lower chest: No acute abnormality. Hepatobiliary: No focal liver abnormality is seen. No gallstones, gallbladder wall thickening, or biliary dilatation. Pancreas: Unremarkable Spleen: Unremarkable Adrenals/Urinary Tract: Adrenal glands are unremarkable. Kidneys are normal, without renal calculi, focal lesion, or hydronephrosis. Bladder is unremarkable. Stomach/Bowel: Stomach is within normal limits. Appendix appears normal. No evidence of bowel wall thickening, distention, or inflammatory changes. Vascular/Lymphatic: No significant vascular findings are present. No enlarged abdominal or pelvic lymph nodes. Reproductive: Prostate is unremarkable. Other: Tiny fat containing right inguinal hernia Musculoskeletal: No acute or significant osseous findings. IMPRESSION: 1. No acute intra-abdominal pathology identified. No definite radiographic  explanation for the patient's reported symptoms. 2. Tiny fat containing right inguinal hernia. Electronically Signed   By: Helyn Numbers M.D.   On: 08/14/2022 19:33    Procedures Procedures    Medications Ordered in ED Medications  potassium chloride 10 mEq in 100 mL IVPB (10 mEq Intravenous New Bag/Given 08/14/22 2132)  sodium chloride 0.9 % bolus 1,000 mL (0 mLs Intravenous Stopped 08/14/22 2026)  ondansetron (ZOFRAN) injection 4 mg (4 mg Intravenous Given 08/14/22 1816)  fentaNYL (SUBLIMAZE) injection 25 mcg (25 mcg Intravenous Given 08/14/22 1817)  LORazepam (ATIVAN) tablet 0.5 mg (0.5 mg Oral Given 08/14/22 1915)  iohexol (OMNIPAQUE) 300 MG/ML solution 100 mL (100 mLs Intravenous Contrast Given 08/14/22 1926)  sodium chloride 0.9 % bolus 1,000 mL (1,000 mLs Intravenous New Bag/Given 08/14/22 2037)  haloperidol lactate (HALDOL) injection 1 mg (1 mg Intravenous Given 08/14/22 2032)    ED Course/ Medical Decision Making/ A&P  Medical Decision Making Patient here for evaluation of persistent nausea vomiting with abdominal pain.  States he is unable to keep down solid foods and continuously "feels like I am about to pass out."   Differential would include but not limited to acute appendicitis, SBO, constipation, gastroenteritis, other viral process, cardiac process considered but felt less likely as he has GI symptoms of vomiting and clinically appears dehydrated.  He is PERC negative.  Amount and/or Complexity of Data Reviewed Labs: ordered.    Details: Labs interpreted by me, no evidence of leukocytosis, hemoglobin unremarkable.  Chemistries show hypokalemia with potassium of 3.  Lipase unremarkable, urinalysis shows ketones and proteinuria without evidence of infection or hematuria Radiology: ordered.    Details: CT abdomen and pelvis ordered for further evaluation of patient's symptoms, appendix appears normal, no acute intra-abdominal findings. Discussion of  management or test interpretation with external provider(s): On recheck, patient has received IV fluids, IV potassium and medication for nausea.  He reports feeling much better and states he is wanting to go home.  Denies any further feeling like he is going to pass out.  The cause of his nausea vomiting unclear at this time, possibly viral.  CT was reassuring.  hypokalemia was addressed here felt to be secondary to his persistent vomiting.   I feel he is appropriate for discharge home with close outpatient follow-up with his PCP.  Prescription will be written for potassium and for Zofran.  He will follow-up with his PCP to have his potassium rechecked in 1 week.  Risk Prescription drug management.           Final Clinical Impression(s) / ED Diagnoses Final diagnoses:  Hypokalemia  Nausea and vomiting, unspecified vomiting type    Rx / DC Orders ED Discharge Orders     None         Rosey Bath 08/14/22 2329    Gloris Manchester, MD 08/17/22 0009

## 2022-08-15 ENCOUNTER — Telehealth: Payer: Self-pay

## 2022-08-15 NOTE — Transitions of Care (Post Inpatient/ED Visit) (Signed)
   08/15/2022  Name: Duane Fleming MRN: [id number] DOB: [birth year]  Today's TOC FU Call Status: Today's TOC FU Call Status:: Successful TOC FU Call Competed TOC FU Call Complete Date: 08/15/22  Transition Care Management Follow-up Telephone Call Date of Discharge: 08/14/22 [name] Type of Discharge: Emergency Department Reason for ED Visit: Other: (hypokalemia) How have you been since you were released from the hospital?: Same Any questions or concerns?: No  Items Reviewed: Did you receive and understand the discharge instructions provided?: Yes Medications obtained,verified, and reconciled?: Yes (Medications Reviewed) Any new allergies since your discharge?: No Dietary orders reviewed?: Yes Do you have support at home?: Yes People in Home: parent(s)  Medications Reviewed Today: Medications Reviewed Today     Reviewed by Duane Addison, LPN (Licensed Practical Nurse) on 08/15/22 at 1453  Med List Status: <None>   Medication Order Taking? Sig Documenting Provider Last Dose Status Informant  albuterol (VENTOLIN HFA) 108 (90 Base) MCG/ACT inhaler [id number] No Inhale 2 puffs into the lungs every 6 (six) hours as needed for wheezing or shortness of breath.  Patient not taking: Reported on 08/14/2022   Duane Layman, MD Not Taking Active   levocetirizine (XYZAL) 5 MG tablet [id number] No Take 1 tablet (5 mg total) by mouth every evening.  Patient not taking: Reported on 08/14/2022   Duane Bamberg, PA-C Not Taking Active   ondansetron [facility]) 4 MG tablet [id number] Yes Take 1 tablet (4 mg total) by mouth every 6 (six) hours. As needed for nausea vomiting Triplett, Tammy, PA-C Taking Active   ondansetron (ZOFRAN-ODT) 8 MG disintegrating tablet [id number] Yes Take 1 tablet (8 mg total) by mouth every 8 (eight) hours as needed for nausea or vomiting. Duane Other G, DO Taking Active   pantoprazole (PROTONIX) 40 MG tablet [id number] No TAKE 1 TABLET (40 MG TOTAL) BY  MOUTH TWICE A DAY BEFORE MEALS  Patient not taking: Reported on 08/14/2022   Duane Danas, MD Not Taking Active   potassium chloride SA (KLOR-CON M) 20 MEQ tablet [id number] Yes Take 1 tablet (20 mEq total) by mouth 2 (two) times daily. Duane Aus, PA-C Taking Active             Home Care and Equipment/Supplies: Were Home Health Services Ordered?: NA Any new equipment or medical supplies ordered?: NA  Functional Questionnaire: Do you need assistance with bathing/showering or dressing?: No Do you need assistance with meal preparation?: No Do you need assistance with eating?: No Do you have difficulty maintaining continence: No Do you need assistance with getting out of bed/getting out of a chair/moving?: No Do you have difficulty managing or taking your medications?: No  Follow up appointments reviewed: PCP Follow-up appointment confirmed?: Yes Date of PCP follow-up appointment?: 08/17/22 Follow-up Provider: Dr Gerda [facility] Follow-up appointment confirmed?: NA Do you need transportation to your follow-up appointment?: No Do you understand care options if your condition(s) worsen?: Yes-patient verbalized understanding    SIGNATURE Duane Addison, LPN [facility] Nurse Health Advisor Direct Dial [phone]

## 2022-08-17 ENCOUNTER — Ambulatory Visit: Payer: No Typology Code available for payment source | Admitting: Family Medicine

## 2022-08-17 VITALS — BP 130/88 | HR 79 | Wt 190.8 lb

## 2022-08-17 DIAGNOSIS — R112 Nausea with vomiting, unspecified: Secondary | ICD-10-CM | POA: Diagnosis not present

## 2022-08-17 DIAGNOSIS — F1290 Cannabis use, unspecified, uncomplicated: Secondary | ICD-10-CM | POA: Diagnosis not present

## 2022-08-17 DIAGNOSIS — F419 Anxiety disorder, unspecified: Secondary | ICD-10-CM | POA: Diagnosis not present

## 2022-08-17 LAB — POCT URINALYSIS DIP (CLINITEK)
Bilirubin, UA: NEGATIVE
Blood, UA: NEGATIVE
Glucose, UA: NEGATIVE mg/dL
Ketones, POC UA: NEGATIVE mg/dL
Leukocytes, UA: NEGATIVE
Nitrite, UA: NEGATIVE
POC PROTEIN,UA: NEGATIVE
Spec Grav, UA: <=1.005 — AB (ref 1.010–1.025)
Urobilinogen, UA: NEGATIVE E.U./dL — AB
pH, UA: 6 (ref 5.0–8.0)

## 2022-08-17 MED ORDER — SUCRALFATE 1 GM/10ML PO SUSP: 1.0000 g | Freq: Three times a day (TID) | ORAL | 0 refills | Status: DC

## 2022-08-17 NOTE — Progress Notes (Signed)
   Subjective:    Patient ID: Duane Fleming, male    DOB: 07-29-2000, 22 y.o.   MRN: [id number]  HPI Patient unfortunately is having severe abdominal discomforts not feeling good with frequent nausea and vomiting and feels nauseous all the time He has had a recent CAT scan and lab work which did not show any obvious surgical issues He complains of epigastric tenderness Can only take sips of liquid frequently throughout the day but is able to urinate [place] Patient denies projectile vomiting He does admit to marijuana use but he states he is cutting back He was smoking very frequently because of underlying anxiety and depression Not suicidal Patient does not want to see a counselor or psychiatrist Recently had low potassium Received IV fluids  Patient arrives today for hospital follow up. Patient states he feels like he is going to pass out.   Review of Systems     Objective:   Physical Exam General-in no acute distress Eyes-no discharge Lungs-respiratory rate normal, CTA CV-no murmurs,RRR Extremities skin warm dry no edema Neuro grossly normal Behavior normal, alert  Patient is clammy but blood pressure stable does not drop with standing Patient feels awful feels like he is going to pass out but does not pass out Patient is taking PPI we will add Carafate Patient is taking his potassium we will check stat labs in the morning he does not want to go today Vital signs laying and sitting stable Previous EGD November 2023 negative for cancer negative for eosinophilia positive for gastritis    Assessment & Plan:   1. Nausea and vomiting, unspecified vomiting type Patient with intermittent epigastric pain discomfort will need to do lab work. Doubt bleeding ulcer but we will add Carafate to see if this helps Continue Zofran  - POCT URINALYSIS DIP (CLINITEK) - Basic metabolic panel - Hepatic function panel - Amylase - CBC with Differential/Platelet  2. Cannabinoid  hyperemesis syndrome Patient encouraged to strongly minimize marijuana use ideally he will cut it out altogether but at the minimum cut it down to maybe a couple times a week currently he smokes every single day  3. Anxiety Counseling would be beneficial  Stat labs tomorrow morning with results based on this may well need other intervention Once we get the lab work back we will consider other measures May benefit from head scan May benefit from workup for Addison's disease Still could be related to hyperemesis from marijuana use but need to make sure we are not overlooking other issues

## 2022-08-18 ENCOUNTER — Encounter: Payer: Self-pay | Admitting: Family Medicine

## 2022-08-18 ENCOUNTER — Other Ambulatory Visit (HOSPITAL_COMMUNITY)
Admission: RE | Admit: 2022-08-18 | Discharge: 2022-08-18 | Disposition: A | Discharge status: Home / Self Care | Payer: No Typology Code available for payment source | Source: Ambulatory Visit | Attending: Family Medicine | Admitting: Family Medicine

## 2022-08-18 ENCOUNTER — Telehealth: Payer: Self-pay | Admitting: Family Medicine

## 2022-08-18 DIAGNOSIS — R112 Nausea with vomiting, unspecified: Secondary | ICD-10-CM | POA: Diagnosis present

## 2022-08-18 LAB — CBC WITH DIFFERENTIAL/PLATELET
Abs Immature Granulocytes: 0.01 10*3/uL (ref 0.00–0.07)
Basophils Absolute: 0 10*3/uL (ref 0.0–0.1)
Basophils Relative: 0 %
Eosinophils Absolute: 0 10*3/uL (ref 0.0–0.5)
Eosinophils Relative: 1 %
HCT: 46.3 % (ref 39.0–52.0)
Hemoglobin: 16.4 g/dL (ref 13.0–17.0)
Immature Granulocytes: 0 %
Lymphocytes Relative: 23 %
Lymphs Abs: 1.4 10*3/uL (ref 0.7–4.0)
MCH: 29.8 pg (ref 26.0–34.0)
MCHC: 35.4 g/dL (ref 30.0–36.0)
MCV: 84 fL (ref 80.0–100.0)
Monocytes Absolute: 0.3 10*3/uL (ref 0.1–1.0)
Monocytes Relative: 5 %
Neutro Abs: 4.3 10*3/uL (ref 1.7–7.7)
Neutrophils Relative %: 71 %
Platelets: 309 10*3/uL (ref 150–400)
RBC: 5.51 MIL/uL (ref 4.22–5.81)
RDW: 12.3 % (ref 11.5–15.5)
WBC: 6 10*3/uL (ref 4.0–10.5)
nRBC: 0 % (ref 0.0–0.2)

## 2022-08-18 LAB — BASIC METABOLIC PANEL
Anion gap: 11 (ref 5–15)
BUN: 7 mg/dL (ref 6–20)
CO2: 26 mmol/L (ref 22–32)
Calcium: 10.1 mg/dL (ref 8.9–10.3)
Chloride: 103 mmol/L (ref 98–111)
Creatinine, Ser: 0.82 mg/dL (ref 0.61–1.24)
GFR, Estimated: >60 mL/min (ref >60)
Glucose, Bld: 95 mg/dL (ref 70–99)
Potassium: 3.6 mmol/L (ref 3.5–5.1)
Sodium: 140 mmol/L (ref 135–145)

## 2022-08-18 LAB — HEPATIC FUNCTION PANEL
ALT: 24 U/L (ref 0–44)
AST: 15 U/L (ref 15–41)
Albumin: 5 g/dL (ref 3.5–5.0)
Alkaline Phosphatase: 59 U/L (ref 38–126)
Bilirubin, Direct: 0.1 mg/dL (ref 0.0–0.2)
Indirect Bilirubin: 0.9 mg/dL (ref 0.3–0.9)
Total Bilirubin: 1 mg/dL (ref 0.3–1.2)
Total Protein: 8.3 g/dL — ABNORMAL HIGH (ref 6.5–8.1)

## 2022-08-18 LAB — AMYLASE: Amylase: 57 U/L (ref 28–100)

## 2022-08-18 NOTE — Telephone Encounter (Signed)
Unable to reach by phone:   Results and provider recommendations  Seen by patient Duane Fleming on 08/18/2022 10:24 AM via my chart

## 2022-08-18 NOTE — Telephone Encounter (Signed)
Please see documentation on his result note please make sure that patient or his mother are aware of the result note thank you

## 2022-08-21 ENCOUNTER — Encounter: Payer: Self-pay | Admitting: Family Medicine

## 2022-08-21 NOTE — Telephone Encounter (Signed)
Nurses Patient recently seen Carafate was added Lab work had looked good Previous CAT scan which was done 7 days ago looked good At the time I saw him he was having more nausea with frequent vomiting and low potassium he was not complaining of right lower quadrant abdominal pain His mom raises the concern of right lower quadrant abdominal pain I would recommend a follow-up visit this week Also recommend urgent consultation with gastroenterology It is her option if she would like for him to be seen with [facility] or with local gastroenterology Either way I doubt they are going to see him this week because most of the specialist office are backed up several weeks Also very important for him to stay away from marijuana as we discussed which has been very difficult for him to follow through on in the past We will discuss further testing when he does his follow-up visit You may share a copy of this with Mikael and his mother via MyChart

## 2022-08-23 ENCOUNTER — Ambulatory Visit: Payer: No Typology Code available for payment source | Admitting: Family Medicine

## 2022-08-23 VITALS — BP 126/87

## 2022-08-23 DIAGNOSIS — R1011 Right upper quadrant pain: Secondary | ICD-10-CM

## 2022-08-23 DIAGNOSIS — R112 Nausea with vomiting, unspecified: Secondary | ICD-10-CM

## 2022-08-23 MED ORDER — PROMETHAZINE HCL 25 MG PO TABS: ORAL_TABLET | ORAL | 2 refills | Status: DC

## 2022-08-23 MED ORDER — DICYCLOMINE HCL 10 MG PO CAPS: ORAL_CAPSULE | ORAL | 1 refills | Status: DC

## 2022-08-23 NOTE — Progress Notes (Unsigned)
   Subjective:    Patient ID: Duane Fleming, male    DOB: July 16, 2000, 22 y.o.   MRN: [id number]  HPI Challenging situation He has ongoing abdominal discomfort This is been something that dates back into last year when he has spells of severe nausea and often will have intermittent vomiting difficulty eating and weight loss feeling fatigued sweaty and tired He has been seen numerous times here Previously he was seen by Dr. Orvan Falconer with [facility] gastroenterology Because of this transition they are desiring to get established in [place] since they live here  In the past he admits only had smoked marijuana multiple times a day he suffers with significant anxiety issues in the past has done counseling but the counseling did not seem to help a lot.  He is also tried medications including antidepressants and it did not help him.  He still smokes marijuana in the evening but states he is cut tremendously down and only uses some sort of thank which is a delta thank and the evening time.  I informed him it would be best to try to get away from all of that to see if that would help with his situation.  In the past when he was utilizing marijuana multiple times a day I told him that his situation of hyperemesis was due to the marijuana.  Unfortunately even with cutting back he is still experiencing symptoms.  Admittedly it could still be related to that but we need to make sure that there is not something more going on   Patient arrives for a follow up on abdominal pain. Patient still not able to eat or keep anything down and weak and dizzy  Review of Systems     Objective:   Physical Exam General-in no acute distress Eyes-no discharge Lungs-respiratory rate normal, CTA CV-no murmurs,RRR Extremities skin warm dry no edema Neuro grossly normal Behavior normal, alert        Assessment & Plan:  1. Nausea and vomiting, unspecified vomiting type Phenergan as needed Zofran as needed -  CMP14+EGFR - US Abdomen Limited RUQ (LIVER/GB) - CBC with Differential/Platelet - Lipase - Cortisol - COMPLETE METABOLIC PANEL WITH eGFR  2. RUQ abdominal pain Stat ultrasound stat lab work - CMP14+EGFR - US Abdomen Limited RUQ (LIVER/GB) - CBC with Differential/Platelet - Lipase - Cortisol - COMPLETE METABOLIC PANEL WITH eGFR  3. Intractable nausea and vomiting May need HIDA may need gastric emptying study may need EGD will do GI consultation his regular gastroenterologist left the practice we will try him to get him established locally - CMP14+EGFR - US Abdomen Limited RUQ (LIVER/GB) - CBC with Differential/Platelet - Lipase - Cortisol - COMPLETE METABOLIC PANEL WITH eGFR  Patient has cut back to marijuana to just once or twice per day compared to where it was  Please see discussion above  We are trying to be systematic with the approach to this may end up having to do gastric emptying study may end up having to do CT scan but for start with ultrasound then potentially HIDA Continue Carafate along with Phenergan along with PPI   we will also try to get consultation with gastroenterology

## 2022-08-24 ENCOUNTER — Ambulatory Visit (HOSPITAL_COMMUNITY)
Admission: RE | Admit: 2022-08-24 | Discharge: 2022-08-24 | Disposition: A | Discharge status: Home / Self Care | Payer: No Typology Code available for payment source | Source: Ambulatory Visit | Attending: Family Medicine | Admitting: Family Medicine

## 2022-08-24 DIAGNOSIS — R112 Nausea with vomiting, unspecified: Secondary | ICD-10-CM | POA: Insufficient documentation

## 2022-08-24 DIAGNOSIS — R1011 Right upper quadrant pain: Secondary | ICD-10-CM | POA: Insufficient documentation

## 2022-08-25 ENCOUNTER — Other Ambulatory Visit (HOSPITAL_COMMUNITY)
Admission: RE | Admit: 2022-08-25 | Discharge: 2022-08-25 | Disposition: A | Discharge status: Home / Self Care | Payer: No Typology Code available for payment source | Source: Ambulatory Visit | Attending: Family Medicine | Admitting: Family Medicine

## 2022-08-25 ENCOUNTER — Other Ambulatory Visit: Payer: Self-pay

## 2022-08-25 ENCOUNTER — Telehealth: Payer: Self-pay | Admitting: Family Medicine

## 2022-08-25 DIAGNOSIS — R112 Nausea with vomiting, unspecified: Secondary | ICD-10-CM | POA: Insufficient documentation

## 2022-08-25 DIAGNOSIS — R1011 Right upper quadrant pain: Secondary | ICD-10-CM

## 2022-08-25 LAB — CBC WITH DIFFERENTIAL/PLATELET
Abs Immature Granulocytes: 0.02 10*3/uL (ref 0.00–0.07)
Basophils Absolute: 0 10*3/uL (ref 0.0–0.1)
Basophils Relative: 0 %
Eosinophils Absolute: 0.2 10*3/uL (ref 0.0–0.5)
Eosinophils Relative: 3 %
HCT: 46.7 % (ref 39.0–52.0)
Hemoglobin: 16.4 g/dL (ref 13.0–17.0)
Immature Granulocytes: 0 %
Lymphocytes Relative: 26 %
Lymphs Abs: 1.4 10*3/uL (ref 0.7–4.0)
MCH: 29.8 pg (ref 26.0–34.0)
MCHC: 35.1 g/dL (ref 30.0–36.0)
MCV: 84.8 fL (ref 80.0–100.0)
Monocytes Absolute: 0.4 10*3/uL (ref 0.1–1.0)
Monocytes Relative: 7 %
Neutro Abs: 3.6 10*3/uL (ref 1.7–7.7)
Neutrophils Relative %: 64 %
Platelets: 308 10*3/uL (ref 150–400)
RBC: 5.51 MIL/uL (ref 4.22–5.81)
RDW: 12.4 % (ref 11.5–15.5)
WBC: 5.6 10*3/uL (ref 4.0–10.5)
nRBC: 0 % (ref 0.0–0.2)

## 2022-08-25 LAB — COMPREHENSIVE METABOLIC PANEL
ALT: 29 U/L (ref 0–44)
AST: 19 U/L (ref 15–41)
Albumin: 4.9 g/dL (ref 3.5–5.0)
Alkaline Phosphatase: 57 U/L (ref 38–126)
Anion gap: 11 (ref 5–15)
BUN: 7 mg/dL (ref 6–20)
CO2: 26 mmol/L (ref 22–32)
Calcium: 9.8 mg/dL (ref 8.9–10.3)
Chloride: 102 mmol/L (ref 98–111)
Creatinine, Ser: 0.72 mg/dL (ref 0.61–1.24)
GFR, Estimated: >60 mL/min (ref >60)
Glucose, Bld: 101 mg/dL — ABNORMAL HIGH (ref 70–99)
Potassium: 3.6 mmol/L (ref 3.5–5.1)
Sodium: 139 mmol/L (ref 135–145)
Total Bilirubin: 0.9 mg/dL (ref 0.3–1.2)
Total Protein: 8 g/dL (ref 6.5–8.1)

## 2022-08-25 LAB — CORTISOL: Cortisol, Plasma: 21.9 ug/dL

## 2022-08-25 LAB — LIPASE, BLOOD: Lipase: 29 U/L (ref 11–51)

## 2022-08-25 NOTE — Telephone Encounter (Signed)
Nurses Please inform family the lab work look good-cortisol pending Please go ahead with initiating HIDA order due to right upper quadrant pain and persistent nausea and vomiting Result note posted Copy of  below  Electrolytes look good, liver enzymes look good, CBC looks good, ultrasound came back looking good but we will be pursuing setting up a HIDA test to look at gallbladder function

## 2022-08-25 NOTE — Telephone Encounter (Signed)
Spoke with patient's mom , Marylene [place] on [place], she is aware and saw everything on mychart. She is letting you know that he does have hemorrhoids and some rectal bleeding. Using OTC preparation H- please advise.

## 2022-08-26 NOTE — Telephone Encounter (Signed)
Unfortunately there is nothing specific that really helps hemorrhoids other than warm compresses, may use Preparation H, keep bowel movements soft use MiraLAX if necessary to keep, soft

## 2022-08-28 NOTE — Telephone Encounter (Signed)
Patient's mom contacted and informed per drs notes.

## 2022-08-29 ENCOUNTER — Telehealth: Payer: Self-pay | Admitting: Family Medicine

## 2022-08-29 ENCOUNTER — Encounter: Payer: Self-pay | Admitting: *Deleted

## 2022-08-29 ENCOUNTER — Telehealth: Payer: Self-pay | Admitting: Internal Medicine

## 2022-08-29 DIAGNOSIS — R112 Nausea with vomiting, unspecified: Secondary | ICD-10-CM

## 2022-08-29 NOTE — Telephone Encounter (Signed)
Patient's PCP reached out to me in regards to this patient.  Can we see if we can get him in for new patient evaluation in 1 to 2 weeks with either myself or an app? Dx Nausea/vomiting  **Previously seen by [facility] though his gastroenterologist left and patient wishes to establish care in [place]**  Thank you!

## 2022-08-29 NOTE — Telephone Encounter (Signed)
Referral ordered in EPIC. My chart message sent to notify patient.

## 2022-08-29 NOTE — Telephone Encounter (Signed)
Nurses Go ahead with referral to Dr. Verl Bangs gastroenterology  Reason for referral-intractable nausea and vomiting  I have communicated with Dr. Marletta Lor who stated he would agree to take the patient on  please have referral coordinator send the referral  Please send MyChart message to patient letting him know referral has been initiated keep all follow-up visits with [place] as well  Thank you

## 2022-08-30 ENCOUNTER — Other Ambulatory Visit: Payer: Self-pay | Admitting: Family Medicine

## 2022-08-30 ENCOUNTER — Telehealth: Payer: Self-pay | Admitting: Family Medicine

## 2022-08-30 MED ORDER — HYDROCORTISONE (PERIANAL) 2.5 % EX CREA: 1.0000 | TOPICAL_CREAM | Freq: Two times a day (BID) | CUTANEOUS | 3 refills | Status: DC

## 2022-08-30 NOTE — Telephone Encounter (Signed)
Mom called states patients Hemorrhoids are really hurting him. She would like to know if Proctosolm could be called in for him? Patient uses [facility].

## 2022-08-30 NOTE — Telephone Encounter (Signed)
Left message and my chart message sent

## 2022-08-30 NOTE — Telephone Encounter (Signed)
Proctosol cream was sent in May apply twice daily as needed Warm sitz bath several times per day Keep bowel movements soft with stool softeners If ongoing troubles may need to see gastroenterology or general surgery

## 2022-09-05 ENCOUNTER — Ambulatory Visit: Payer: No Typology Code available for payment source | Admitting: Family Medicine

## 2022-09-05 VITALS — BP 134/83 | HR 87 | Wt 190.2 lb

## 2022-09-05 DIAGNOSIS — R1011 Right upper quadrant pain: Secondary | ICD-10-CM

## 2022-09-05 DIAGNOSIS — R112 Nausea with vomiting, unspecified: Secondary | ICD-10-CM | POA: Diagnosis not present

## 2022-09-05 DIAGNOSIS — F322 Major depressive disorder, single episode, severe without psychotic features: Secondary | ICD-10-CM

## 2022-09-05 MED ORDER — FLUOXETINE HCL 20 MG PO TABS
20.0000 mg | ORAL_TABLET | Freq: Every day | ORAL | 3 refills | Status: DC
Start: 2022-09-05 — End: 2022-10-02

## 2022-09-05 MED ORDER — PROMETHAZINE HCL 25 MG PO TABS: ORAL_TABLET | ORAL | 2 refills | Status: DC

## 2022-09-05 NOTE — Progress Notes (Addendum)
   Subjective:    Patient ID: Duane Fleming, male    DOB: 10-06-2000, 22 y.o.   MRN: [id number]  HPI Patient arrives today with nausea and vomiting abdominal pain.  Patient with significant nausea and vomiting just not feeling well has HIDA test coming up tomorrow has had previous scans as well as lab work and ultrasound Phenergan currently does help him  He relates a lot of chronic anxiety and depression finds himself feeling down feels like his situation is hopeless at times at times he does thinking about hurting himself but at the same time does not have an active plan.  When questioned how he would hurt himself he states that he would shoot himself but he also states that he does not have a gun.  He also states what keeps him from doing this is does not want his mom to find him that way and he would not want to disappoint his mom.  He states he is not actively considering suicide currently.  Previously patient stated that he did not want to have any medication or counseling but now he is open to doing so.  Patient states no changes in regards to the nausea and he finds himself able to drink liquids and then along goldfish but not able to eat any substantial food.     Review of Systems     Objective:   Physical Exam General-in no acute distress Eyes-no discharge Lungs-respiratory rate normal, CTA CV-no murmurs,RRR Extremities skin warm dry no edema Neuro grossly normal Behavior normal, alert Abdomen soft with moderate tenderness right upper quadrant mid abdomen       Assessment & Plan:  1. Nausea and vomiting, unspecified vomiting type Continue Phenergan as needed Patient currently able to drink and nibble on goldfish Long term needs further workup with GI we will try to get him in  2. RUQ abdominal pain HIDA tomorrow await results  3. Depression, major, single episode, severe (HCC) Patient with significant anxiety and depression he agrees to see counselor his mom is  trying to get him in with a counselor she sees but he does need to see psychiatry Start Prozac Suicidal ideation but no specific plan and currently states he is not going to harm himself We will recheck him again in 1 week's time Emergency plan for suicide discussed with family - Ambulatory referral to Psychiatry We will also go forward with urgent psychiatry consultation Message has been sent to the referral coordinator to help work together toward trying to get him some help relatively soon  Patient was warned that if the Prozac makes him feel more depressed or more suicidal ideation stop medicine immediately and reach out for help here or the ER

## 2022-09-06 ENCOUNTER — Encounter (HOSPITAL_COMMUNITY): Payer: No Typology Code available for payment source

## 2022-09-11 ENCOUNTER — Telehealth (INDEPENDENT_AMBULATORY_CARE_PROVIDER_SITE_OTHER): Payer: No Typology Code available for payment source | Admitting: Family Medicine

## 2022-09-11 DIAGNOSIS — F1290 Cannabis use, unspecified, uncomplicated: Secondary | ICD-10-CM | POA: Diagnosis not present

## 2022-09-11 DIAGNOSIS — R112 Nausea with vomiting, unspecified: Secondary | ICD-10-CM

## 2022-09-11 DIAGNOSIS — F32A Depression, unspecified: Secondary | ICD-10-CM

## 2022-09-11 DIAGNOSIS — F322 Major depressive disorder, single episode, severe without psychotic features: Secondary | ICD-10-CM

## 2022-09-11 DIAGNOSIS — F411 Generalized anxiety disorder: Secondary | ICD-10-CM

## 2022-09-11 NOTE — Progress Notes (Signed)
Subjective:    Patient ID: Duane Fleming, male    DOB: Apr 04, 2001, 22 y.o.   MRN: [id number]  HPI varicose veinsMr. Daddario,you are scheduled for a virtual visit with your provider today.    Just as we do with appointments in the office, we must obtain your consent to participate.  Your consent will be active for this visit and any virtual visit you may have with one of our providers in the next 365 days.    If you have a MyChart account, I can also send a copy of this consent to you electronically.  All virtual visits are billed to your insurance company just like a traditional visit in the office.  As this is a virtual visit, video technology does not allow for your provider to perform a traditional examination.  This may limit your provider's ability to fully assess your condition.  If your provider identifies any concerns that need to be evaluated in person or the need to arrange testing such as labs, EKG, etc, we will make arrangements to do so.    Although advances in technology are sophisticated, we cannot ensure that it will always work on either your end or our end.  If the connection with a video visit is poor, we may have to switch to a telephone visit.  With either a video or telephone visit, we are not always able to ensure that we have a secure connection.   I need to obtain your verbal consent now.   Are you willing to proceed with your visit today?   [name] has provided verbal consent on 09/11/2022 for a virtual visit (video or telephone).  Virtual Visit via Video Note  I connected with Duane Fleming on 09/11/22 at  3:10 PM EDT by a video enabled telemedicine application and verified that I am speaking with the correct person using two identifiers.  Location: Patient: Home Provider: Office   I discussed the limitations of evaluation and management by telemedicine and the availability of in person appointments. The patient expressed understanding and agreed to  proceed.  History of Present Illness:    Observations/Objective:   Assessment and Plan:   Follow Up Instructions:    I discussed the assessment and treatment plan with the patient. The patient was provided an opportunity to ask questions and all were answered. The patient agreed with the plan and demonstrated an understanding of the instructions.   The patient was advised to call back or seek an in-person evaluation if the symptoms worsen or if the condition fails to improve as anticipated.  I provided 15 minutes of non-face-to-face time during this encounter.   Duane Punt, MD   Patient's phone number at home [phone]       Review of Systems     Objective:   Physical Exam Patient had virtual visit-video Appears to be in no distress Atraumatic Neuro able to relate and oriented No apparent resp distress Color normal Video visit Mother present during video visit        Assessment & Plan:  Patient seen gastroenterology tomorrow Patient was unable to do HIDA test because of panic attack Patient is able to tolerate goldfish and lunch troubles with the current medicines Patient states he is cut way back on marijuana  Major depression not currently suicidal on Prozac 20 mg tolerating well has counseling full session later this week has visit with psychiatry next week family will keep [place] updated  I plan to  call the patient tomorrow late afternoon to get update on how his visit with gastroenterology And to talk with him further regarding his mental health

## 2022-09-12 ENCOUNTER — Encounter: Payer: Self-pay | Admitting: Gastroenterology

## 2022-09-12 ENCOUNTER — Ambulatory Visit (INDEPENDENT_AMBULATORY_CARE_PROVIDER_SITE_OTHER): Payer: No Typology Code available for payment source | Admitting: Gastroenterology

## 2022-09-12 VITALS — BP 116/67 | HR 93 | Temp 98.9°F | Ht 71.0 in | Wt 195.8 lb

## 2022-09-12 DIAGNOSIS — R634 Abnormal weight loss: Secondary | ICD-10-CM | POA: Diagnosis not present

## 2022-09-12 DIAGNOSIS — K5900 Constipation, unspecified: Secondary | ICD-10-CM

## 2022-09-12 DIAGNOSIS — R110 Nausea: Secondary | ICD-10-CM

## 2022-09-12 DIAGNOSIS — K644 Residual hemorrhoidal skin tags: Secondary | ICD-10-CM

## 2022-09-12 DIAGNOSIS — R1011 Right upper quadrant pain: Secondary | ICD-10-CM | POA: Diagnosis not present

## 2022-09-12 MED ORDER — METOCLOPRAMIDE HCL 5 MG PO TABS
5.0000 mg | ORAL_TABLET | Freq: Three times a day (TID) | ORAL | 0 refills | Status: DC
Start: 2022-09-12 — End: 2022-10-16

## 2022-09-12 NOTE — Progress Notes (Unsigned)
GI Office Note    Referring Provider: Babs Sciara, MD Primary Care Physician:  Babs Sciara, MD  Primary Gastroenterologist: Hennie Duos. Marletta Lor, DO  Chief Complaint   Chief Complaint  Patient presents with   Abdominal Pain    Referred for abdominal pain, nausea and vomiting. Started about 4 -5 weeks ago. Takes zofran and phenergan. Has not been eating much. Mostly goldfish crackers, gatoraide and sprite. Mother reports 60 lbs weight loss in past year.    Hemorrhoids    Having some pain with hemorrhoids. Tried tuck pads and a steroid cream from pcp and not getting better. Having a hard time to have BM. Having to strain. Has passed out 04-01-24and EMS was called.    History of Present Illness   Duane Fleming is a 22 y.o. male presenting today at the request of Luking, Jonna Coup, MD for nausea/vomiting.   Prior workup for fatty liver in 2021 which included elevated liver enzymes but with normal ANA, ceruloplasmin, ferritin, negative for hepatitis B and hepatitis A.  A1c also normal.  Lipid panel did show elevated cholesterol as well as triglycerides.  Endoscopy recommended at that time as well but patient decided to not pursue.  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  Previously seen by Dr. Orvan Falconer at [facility].  Last office visit 02/22/2022.  Reported a 25 pound weight loss with associated anorexia.  Also having intermittent diarrhea on top of nausea and vomiting.  Impression from previous visit reported negative serologic workup for celiac and H. pylori negative.  Broad differentials including reflux, gastritis, and cannabis hyperemesis syndrome.  Also advised to the manifestation of anxiety.  Reported frequent marijuana use for many years.  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  Ultrasound in 2021 showing fatty liver.  Advise  effective treatment for anxiety may ultimately improve his GI symptoms.  Plan was for pantoprazole 40 mg daily and famotidine 20 mg twice daily.  Advised to schedule EGD.  Discussed marijuana as possible cause of symptoms and recommended trial 4 weeks without smoking to see if symptoms improve.  Advised to consider cross-sectional imaging and EGD and biopsies nondiagnostic.  He previously expressed that he did not feel as though marijuana was the issue given he was smoking for many years and his symptoms have began more recently prior to his visit.  Not interested in counseling or medication for stress, anxiety, or depression.  No known family history of colon cancer or polyps or any other GI malignancy.  Sister does have EOE and food allergies and currently undergoing evaluation for IBD.  Prior serologic workup: Negative H. pylori antibody, negative celiac serologies.  Normal CRP, ESR 17.   Liver labs normal including mono, ANA, LK M antibody, ASMA, IgG.  EGD 02/23/2022: -Examined esophagus normal s/p biopsy -Patchy minimal inflammation characterized by granularity found in the gastric body s/p biopsy -Normal examined duodenum -Cardia and gastric fundus normal -Advised to continue pantoprazole 40 mg every morning and famotidine 20 mg twice daily. -Path: Mild chronic gastritis, stains negative for H. pylori.  Benign small bowel mucosa.  Esophageal changes with mild reflux changes.  No EOE.  CT A/P 08/14/2022: -No acute intra-abdominal pathology identified -Tiny fat-containing right inguinal hernia  RUQ [place] 08/24/2022: -CBD 3.4 mm -Normal parenchymal echogenicity -Patent Doppler  Seen by PCP 09/05/2022: Given right upper quadrant pain HIDA was ordered.  Appears patient was unable to tolerate this due to anxiety.  His anxiety and depression was addressed at this visit and he was advised to start Prozac and monitor for suicidal ideation.  Also given referral to psychiatry.-Taking Zofran and Phenergan  as needed for nausea and vomiting.  Today:  About every 6 months he is having days of N/V and unable to get good intake. This last time he passed out and was having small green pieces of stools - usually complains of constipation and then hard for him to pee and have a BM and feels like stomach does not work. Can't lay on his left side and feels liek things are stuck there and the pain increases and he feels like there is something blocking it. Has been going to the PCP weekly and he has developed hemorrhoids over the last 2 weeks. Does have some burning and itching. Hemorrhoids are external and feels like there are 3 areas present. Has been using proctosol for this. Has been using tucks pads as well and that helps for relief.   Vomiting is okay with Zofran and phenergan. Mostly eating goldfish and drinking Gatorade and sprite. Phenergan at least gives him an appetite and only takes it in the evening. The pain is constant and is always in the lower abdomen and on the right side.  Taking delta 8, has cut back significantly on marijuana use. Has not seemed to improved his symptoms. Using the delta to help with appetite and sleep.  No melena or brbpr.   Mom states he has lost about 60 lbs over the last year.   Current Outpatient Medications  Medication Sig Dispense Refill   albuterol (VENTOLIN HFA) 108 (90 Base) MCG/ACT inhaler Inhale 2 puffs into the lungs every 6 (six) hours as needed for wheezing or shortness of breath. 18 g 1   FLUoxetine (PROZAC) 20 MG tablet Take 1 tablet (20 mg total) by mouth daily. 30 tablet 3   hydrocortisone (PROCTOSOL HC) 2.5 % rectal cream Place 1 Application rectally 2 (two) times daily. 30 g 3   ondansetron (ZOFRAN-ODT) 8 MG disintegrating tablet Take 1 tablet (8 mg total) by mouth every 8 (eight) hours as needed for nausea or vomiting. 20 tablet 3   pantoprazole (PROTONIX) 40 MG tablet TAKE 1 TABLET (40 MG TOTAL) BY MOUTH TWICE A DAY BEFORE MEALS 180 tablet 1    promethazine (PHENERGAN) 25 MG tablet 1/2 to 1 q 6 hours prn nausea 25 tablet 2   sucralfate (CARAFATE) 1 GM/10ML suspension Take 10 mLs (1 g total) by mouth 4 (four) times daily -  with meals and at bedtime. 420 mL 0   dicyclomine (BENTYL) 10 MG capsule One tid prn nausea and abd pain (Patient not taking: Reported on 09/12/2022) 60 capsule 1   No current facility-administered medications for this visit.    Past Medical History:  Diagnosis Date   Abrasion of leg 10/23/2016   Anxiety    Asthma    Chronic headache    Depression    Difficulty controlling anger    Family history of adverse reaction to anesthesia    maternal grandmother has issues with anxiety when waking from anesthesia   Tonsillar and adenoid hypertrophy 10/2016   mother unsure of apnea/snoring during sleep    Past Surgical History:  Procedure Laterality Date   ESOPHAGOGASTRODUODENOSCOPY     FRENULECTOMY, LINGUAL     TONSILLECTOMY AND ADENOIDECTOMY Bilateral 10/30/2016   Procedure: TONSILLECTOMY AND ADENOIDECTOMY;  Surgeon: Newman Pies,  MD;  Location: [facility];  Service: ENT;  Laterality: Bilateral;    Family History  Problem Relation Age of Onset   Asthma Sister    Hypertension Maternal Grandmother    Anesthesia problems Maternal Grandmother        anxiety when waking from anesthesia   Hypertension Maternal Grandfather    Esophageal cancer Neg Hx    Stomach cancer Neg Hx     Allergies as of 09/12/2022 - Review Complete 09/12/2022  Allergen Reaction Noted   Keflex [cephalexin] Rash 12/15/2015    Social History   Socioeconomic History   Marital status: Single    Spouse name: Not on file   Number of children: 0   Years of education: Not on file   Highest education level: Not on file  Occupational History   Occupation: full time student  Tobacco Use   Smoking status: Every Day    Types: Cigarettes, E-cigarettes    Passive exposure: Current   Smokeless tobacco: Never  Vaping Use    Vaping Use: Every day  Substance and Sexual Activity   Alcohol use: No   Drug use: No   Sexual activity: Not on file  Other Topics Concern   Not on file  Social History Narrative   Not on file   Social Determinants of Health   Financial Resource Strain: Not on file  Food Insecurity: Not on file  Transportation Needs: Not on file  Physical Activity: Not on file  Stress: Not on file  Social Connections: Not on file  Intimate Partner Violence: Not on file     Review of Systems   Gen: Denies any fever, chills, fatigue, weight loss, lack of appetite.  CV: Denies chest pain, heart palpitations, peripheral edema, syncope.  Resp: Denies shortness of breath at rest or with exertion. Denies wheezing or cough.  GI: see HPI GU : Denies urinary burning, urinary frequency, urinary hesitancy MS: Denies joint pain, muscle weakness, cramps, or limitation of movement.  Derm: Denies rash, itching, dry skin Psych: Denies depression, anxiety, memory loss, and confusion Heme: Denies bruising, bleeding, and enlarged lymph nodes.   Physical Exam   BP 116/67 (BP Location: Right Arm, Patient Position: Sitting, Cuff Size: Large)   Pulse 93   Temp 98.9 F (37.2 C) (Oral)   Ht 5\' 11"  (1.803 m)   Wt 195 lb 12.8 oz (88.8 kg)   BMI 27.31 kg/m   General:   Alert and oriented. Pleasant and cooperative. Well-nourished and well-developed.  Head:  Normocephalic and atraumatic. Eyes:  Without icterus, sclera clear and conjunctiva pink.  Ears:  Normal auditory acuity. Mouth:  No deformity or lesions, oral mucosa pink.  Lungs:  Clear to auscultation bilaterally. No wheezes, rales, or rhonchi. No distress.  Heart:  S1, S2 present without murmurs appreciated.  Abdomen:  +BS, soft, non-tender and non-distended. TTP throughout abdomen. + murphys (no evidence of cholecystitis on imaging) No HSM noted. No guarding or rebound. No masses appreciated.  Rectal:  Deferred  Msk:  Symmetrical without gross  deformities. Normal posture. Extremities:  Without edema. Neurologic:  Alert and  oriented x4;  grossly normal neurologically. Skin:  Intact without significant lesions or rashes. Psych:  Alert and cooperative. Normal mood and affect.   Assessment   Panagiotis A Gentil is a 22 y.o. male with a history of anxiety, depression, weight loss, chronic nausea/vomiting presenting today for further evaluation of nausea/vomiting and right upper quadrant pain.  Nausea, vomiting, weight loss:  Right upper quadrant pain:   Hemorrhoids, constipation, lower abdominal pain:   PLAN   *** Continue Zofran as needed for nausea Continue pantoprazole 40 mg once daily. Briefly for 2 weeks increase to BID. Stop phenergan and start Reglan 5 mg TID. Discussed side effects to be aware of.  HIDA scan Continue stool softener Continue prozac Follow up in 4 weeks   Brooke Bonito, MSN, FNP-BC, AGACNP-BC [facility]

## 2022-09-12 NOTE — Patient Instructions (Addendum)
Stop Phenergan and start Reglan 5 mg 3 times daily.  Monitor for headaches, dizziness, confusion, or involuntary movements.  Please stop immediately if this occurs.  May continue to use Zofran as needed for breakthrough nausea.  Briefly increase pantoprazole to 40 mg twice daily for 2 weeks.  If no improvement after 2 weeks he may decrease to once daily.  Continue hemorrhoid cream and Tucks pads for external hemorrhoid symptoms.  May continue stool softener for constipation. If worsening constipation may use exlax to avoid straining.  We will plan to follow-up in 4 weeks.  Will be in touch with any further recommendations after HIDA scan performed.  It was a pleasure to see you today. I want to create trusting relationships with patients. If you receive a survey regarding your visit,  I greatly appreciate you taking time to fill this out on paper or through your MyChart. I value your feedback.  Brooke Bonito, MSN, FNP-BC, AGACNP-BC [facility]

## 2022-09-13 ENCOUNTER — Telehealth (INDEPENDENT_AMBULATORY_CARE_PROVIDER_SITE_OTHER): Payer: Self-pay | Admitting: Gastroenterology

## 2022-09-13 NOTE — Telephone Encounter (Signed)
HIDA ordered. No pre cert needed per insurance. Spoke with Misty at [employer] Ref# [id number].  Contacted nuclear med and had pt scheduled for 09/15/22 at 8am. Looked at [facility] and pt has mom cell number to call but no name to be able to speak with anyone. Spoke with Dewayne Hatch to get clarifiication if I could speak with mom or not. Ann suggeted that pt come to office to fill out paper correctly with moms name on it. Called mobile number listed on DPR and informed Marylene Land (mom) that pt would need to come by office and fill out form completely so we can discuss information with her.  Mom states she is the only person that can take pt and is not available this week. Mom states she can take him anytime after 09/19/22.  Contacted nuclear med and asked if we could change appt to after 09/19/22. Pt is now scheduled for 09/22/22 at 8:00am. NPO after midnight, no pain med morning of and pt will be with them for about 2-2.5 hours.   Mom will have pt return call so I can inform him of appt. I will also send a my chart message.

## 2022-09-14 NOTE — Telephone Encounter (Signed)
Pt returned call and was advised of his appt for 09/22/22

## 2022-09-15 ENCOUNTER — Inpatient Hospital Stay (HOSPITAL_COMMUNITY): Admission: RE | Admit: 2022-09-15 | Payer: No Typology Code available for payment source | Source: Ambulatory Visit

## 2022-09-22 ENCOUNTER — Ambulatory Visit (HOSPITAL_COMMUNITY)
Admission: RE | Admit: 2022-09-22 | Discharge: 2022-09-22 | Disposition: A | Discharge status: Home / Self Care | Payer: PRIVATE HEALTH INSURANCE | Source: Ambulatory Visit | Attending: Gastroenterology | Admitting: Gastroenterology

## 2022-09-22 ENCOUNTER — Encounter (HOSPITAL_COMMUNITY): Payer: Self-pay

## 2022-09-22 DIAGNOSIS — R1011 Right upper quadrant pain: Secondary | ICD-10-CM | POA: Insufficient documentation

## 2022-09-22 DIAGNOSIS — R110 Nausea: Secondary | ICD-10-CM | POA: Diagnosis not present

## 2022-09-22 MED ORDER — TECHNETIUM TC 99M MEBROFENIN IV KIT
5.0000 | PACK | Freq: Once | INTRAVENOUS | Status: AC | PRN
  Administered 2022-09-22: 5.1 via INTRAVENOUS

## 2022-09-25 ENCOUNTER — Encounter: Payer: Self-pay | Admitting: *Deleted

## 2022-09-28 ENCOUNTER — Other Ambulatory Visit: Payer: Self-pay | Admitting: Family Medicine

## 2022-10-15 NOTE — Progress Notes (Signed)
GI Office Note    Referring Provider: Babs Sciara, MD Primary Care Physician:  Babs Sciara, MD Primary Gastroenterologist: Hennie Duos. Marletta Lor, DO  Date:  10/16/2022  ID:  Ellwood Dense, DOB 2000-06-21, MRN [id number]   Chief Complaint   Chief Complaint  Patient presents with   Follow-up    Not any better, still feels like he is going to pass out   History of Present Illness  Wess A Isenhower is a 22 y.o. male with a history of anxiety, depression, weight loss, chronic nausea/vomiting, and chronic RUQ pain presenting today for follow up.    Prior workup for fatty liver in 2021 which included elevated liver enzymes but with normal ANA, ceruloplasmin, ferritin, negative for hepatitis B and hepatitis A.  A1c also normal.  Lipid panel did show elevated cholesterol as well as triglycerides.  Endoscopy recommended at that time as well but patient decided to not pursue.   Colonoscopy for rectal bleeding in 2009 which showed benign lymphatic hyperplasia (results unavailable in epic).  Previously seen by Dr. Orvan Falconer at [facility].  Last office visit 02/22/2022.  Reported a 25 pound weight loss with associated anorexia.  Also having intermittent diarrhea on top of nausea and vomiting.  Impression from previous visit reported negative serologic workup for celiac and H. pylori negative.  Broad differentials including reflux, gastritis, and cannabis hyperemesis syndrome.  Also advised to the manifestation of anxiety.  Reported frequent marijuana use for many years.  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  Ultrasound in 2021 showing fatty liver.  Advise effective treatment for anxiety may ultimately improve his GI symptoms.  Plan was for pantoprazole 40 mg daily and famotidine 20 mg twice daily.  Advised to schedule EGD.  Discussed marijuana as possible cause of symptoms and recommended trial 4 weeks without smoking to  see if symptoms improve.  Advised to consider cross-sectional imaging and EGD and biopsies nondiagnostic.  He previously expressed that he did not feel as though marijuana was the issue given he was smoking for many years and his symptoms have began more recently prior to his visit.  Not interested in counseling or medication for stress, anxiety, or depression.  No known family history of colon cancer or polyps or any other GI malignancy.  Sister does have EOE and food allergies and currently undergoing evaluation for IBD.   Prior serologic workup: Negative H. pylori antibody, negative celiac serologies.  Normal CRP, ESR 17.   Liver labs normal including mono, ANA, LK M antibody, ASMA, IgG.   EGD 02/23/2022: -Examined esophagus normal s/p biopsy -Patchy minimal inflammation characterized by granularity found in the gastric body s/p biopsy -Normal examined duodenum -Cardia and gastric fundus normal -Advised to continue pantoprazole 40 mg every morning and famotidine 20 mg twice daily. -Path: Mild chronic gastritis, stains negative for H. pylori.  Benign small bowel mucosa.  Esophageal changes with mild reflux changes.  No EOE.   CT A/P 08/14/2022: -No acute intra-abdominal pathology identified -Tiny fat-containing right inguinal hernia   RUQ [place] 08/24/2022: -CBD 3.4 mm -Normal parenchymal echogenicity -Patent Doppler   Seen by PCP 09/05/2022: Given right upper quadrant pain HIDA was ordered.  Appears patient was unable to tolerate this due to anxiety.  His anxiety and depression was addressed at this visit and he was advised to start Prozac and monitor for suicidal ideation.  Also given referral to psychiatry. Taking Zofran and Phenergan  as needed for nausea and vomiting. Cortisol normal.   Last office visit 09/12/22.  Has been having days of nausea/vomiting and poor p.o. intake about every 6 months.  Also continuing to feel like he will intermittently pass out and having small green pieces of stool.   Usually complains of constipation with difficulty peeing.  At times feeling something is blocking his stool or stomach from working.  I developed hemorrhoids due to frequent trips to the bathroom.  Does have some burning and itching.  Has been using Proctosol and Tucks pads which provide relief.  Vomiting has been fairly controlled with Zofran.  But has had poor nutritional intake.  He has been able to eat with Phenergan but only usually in the evenings.  Continues to have pain on the lower right side of the abdomen. Advised to continue Zofra as needed, stop phenergan and trial reglan. Briefly increase PPI for 2 weeks. Follow through with HIDA scan. Continue prozac and stool softener and avoid straining.   HIDA scan 09/22/22: Gallbladder EF 50%  Patient reported Reglan constipated him but had been able to eat better with phenergan and felt that was more helpful. Advised could trial hyoscyamine but no reply from patient via mychart if he would like to try.    Today:  Has been able to eat a good meal once per day with the phenergan.   Keeps feeling like he is going to pass out.  Still having abdominal pain all over.   Trying to exercise some in his bedroom and move around the house but then feels dizzy and needs to lay down. He is trying but still feels terrible. Prozac was recently increased and started and increased on clonidine by psychiatrist [facility] and behavioral). Has been doing online therapy as well.   Has had some tinnitus but thinks that may be from listening to mucus too loud. At times room feels dark when he fels like he may pass out and like the room is rocking back and forth.   Hemorrhoids are better but not feeling like he is emptying all the was and having issues with urination at night. Drinks a lot of water. Has only used a stool softner.    Wt Readings from Last 3 Encounters:  10/16/22 204 lb 6.4 oz (92.7 kg) (94 %, Z= 1.53)*  09/12/22 195 lb 12.8 oz (88.8 kg) (91  %, Z= 1.33)*  09/05/22 190 lb 3.2 oz (86.3 kg) (88 %, Z= 1.19)*   * Growth percentiles are based on CDC (Boys, 2-20 Years) data.    Current Outpatient Medications  Medication Sig Dispense Refill   albuterol (VENTOLIN HFA) 108 (90 Base) MCG/ACT inhaler Inhale 2 puffs into the lungs every 6 (six) hours as needed for wheezing or shortness of breath. 18 g 1   cloNIDine (CATAPRES) 0.2 MG tablet Take 0.2 mg by mouth 2 (two) times daily as needed.     FLUoxetine (PROZAC) 20 MG tablet TAKE 1 TABLET BY MOUTH EVERY DAY 90 tablet 0   hydrocortisone (PROCTOSOL HC) 2.5 % rectal cream Place 1 Application rectally 2 (two) times daily. 30 g 3   ondansetron (ZOFRAN-ODT) 8 MG disintegrating tablet Take 1 tablet (8 mg total) by mouth every 8 (eight) hours as needed for nausea or vomiting. 20 tablet 3   pantoprazole (PROTONIX) 40 MG tablet TAKE 1 TABLET (40 MG TOTAL) BY MOUTH TWICE A DAY BEFORE MEALS 180 tablet 1   promethazine (PHENERGAN) 25 MG tablet 1/2 to 1 q  6 hours prn nausea 25 tablet 2   No current facility-administered medications for this visit.    Past Medical History:  Diagnosis Date   Abrasion of leg 10/23/2016   Anxiety    Asthma    Chronic headache    Depression    Difficulty controlling anger    Family history of adverse reaction to anesthesia    maternal grandmother has issues with anxiety when waking from anesthesia   Tonsillar and adenoid hypertrophy 10/2016   mother unsure of apnea/snoring during sleep    Past Surgical History:  Procedure Laterality Date   ESOPHAGOGASTRODUODENOSCOPY     FRENULECTOMY, LINGUAL     TONSILLECTOMY AND ADENOIDECTOMY Bilateral 10/30/2016   Procedure: TONSILLECTOMY AND ADENOIDECTOMY;  Surgeon: Newman Pies, MD;  Location: [facility];  Service: ENT;  Laterality: Bilateral;    Family History  Problem Relation Age of Onset   Asthma Sister    Hypertension Maternal Grandmother    Anesthesia problems Maternal Grandmother        anxiety when  waking from anesthesia   Hypertension Maternal Grandfather    Esophageal cancer Neg Hx    Stomach cancer Neg Hx     Allergies as of 10/16/2022 - Review Complete 10/16/2022  Allergen Reaction Noted   Keflex [cephalexin] Rash 12/15/2015    Social History   Socioeconomic History   Marital status: Single    Spouse name: Not on file   Number of children: 0   Years of education: Not on file   Highest education level: Not on file  Occupational History   Occupation: full time student  Tobacco Use   Smoking status: Every Day    Types: Cigarettes, E-cigarettes    Passive exposure: Current   Smokeless tobacco: Never  Vaping Use   Vaping Use: Every day  Substance and Sexual Activity   Alcohol use: No   Drug use: No   Sexual activity: Not on file  Other Topics Concern   Not on file  Social History Narrative   Not on file   Social Determinants of Health   Financial Resource Strain: Not on file  Food Insecurity: Not on file  Transportation Needs: Not on file  Physical Activity: Not on file  Stress: Not on file  Social Connections: Not on file     Review of Systems   Gen: Denies fever, chills, anorexia. Denies fatigue, weakness, weight loss.  CV: Denies chest pain, palpitations, syncope, peripheral edema, and claudication. Resp: Denies dyspnea at rest, cough, wheezing, coughing up blood, and pleurisy. GI: See HPI Derm: Denies rash, itching, dry skin Psych: Denies depression, anxiety, memory loss, confusion. No homicidal or suicidal ideation.  Heme: Denies bruising, bleeding, and enlarged lymph nodes.  Physical Exam   BP 120/77 (BP Location: Right Arm, Patient Position: Sitting, Cuff Size: Large)   Pulse 94   Temp 98.2 F (36.8 C) (Oral)   Ht 5\' 9"  (1.753 m)   Wt 204 lb 6.4 oz (92.7 kg)   SpO2 98%   BMI 30.18 kg/m   General:   Alert and oriented. No distress noted. Pleasant and cooperative.  Head:  Normocephalic and atraumatic. Eyes:  Conjuctiva clear without  scleral icterus. Mouth:  Oral mucosa pink and moist. Good dentition. No lesions. Lungs:  Clear to auscultation bilaterally. No wheezes, rales, or rhonchi. No distress.  Heart:  S1, S2 present without murmurs appreciated.  Abdomen:  +BS, soft, non-distended. TTP throughout. No rebound or guarding. No HSM or masses noted. Rectal:  deferred Msk:  Symmetrical without gross deformities. Normal posture. Extremities:  Without edema. Neurologic:  Alert and  oriented x4 Psych:  Alert and cooperative. Normal mood and affect.   Assessment  Zade A Kanner is a 22 y.o. male with a history of anxiety, depression, weight loss, chronic nausea/vomiting, and chronic RUQ pain with extensive evaluation presenting today for follow up.    N/V, weight loss, abdominal pain: Previous reports of 60lb weight loss. Since last visit her has bene able to gain a little weight. Phenergan has been helpful to eat one meal per day and has been the only thing to help with nausea. No loner using any THC  or CBD products. Continues to have daily nausea and abdominal pain that remains throughout the upper and lower abdomen. Etiology remains unclear as extensive workup has been unrevealing including [place], CT, EGD, HIDA, and labs including celiac and H. Pylori. Has tried reglan for nausea however this caused constipation. Will trial levsin for abdominal pain.Will check basic labs as well as TSH.   Has also had complaints with dizziness, tinnitus, and feelings of syncope. Concern for vertigo  and discussed that these symptoms are likely not related to GI issues but certainly N/V could be related if vertigo present. Discussed he may need to see PCP regarding this or considering neuro vs ENT consult.   Constipation, hemorrhoids: Reglan caused constipation but query some mild baseline constipation as cause of lower abdominal pain given his complaints of incomplete emptying. Also has been complaining of difficulty with urination related to  lower abdominal pain and constipation. Hemorrhoids have improved from prior. No rectal bleeding.   PLAN   CBC, CMP, TSH Trial Levsin up to TID Continue phenergan as needed.  Continue pantoprazole 40 mg once daily.  Continue prozac Start miralax 17g once daily in 8 oz water Can continue stool softener.  May need evaluation with ENT for evaluation of possible vertigo.  Follow up in 4-6 weeks   Brooke Bonito, MSN, FNP-BC, AGACNP-BC [facility]

## 2022-10-16 ENCOUNTER — Encounter: Payer: Self-pay | Admitting: Gastroenterology

## 2022-10-16 ENCOUNTER — Ambulatory Visit (INDEPENDENT_AMBULATORY_CARE_PROVIDER_SITE_OTHER): Payer: No Typology Code available for payment source | Admitting: Gastroenterology

## 2022-10-16 ENCOUNTER — Encounter: Payer: Self-pay | Admitting: Family Medicine

## 2022-10-16 VITALS — BP 120/77 | HR 94 | Temp 98.2°F | Ht 69.0 in | Wt 204.4 lb

## 2022-10-16 DIAGNOSIS — R112 Nausea with vomiting, unspecified: Secondary | ICD-10-CM

## 2022-10-16 DIAGNOSIS — K5900 Constipation, unspecified: Secondary | ICD-10-CM

## 2022-10-16 DIAGNOSIS — R1084 Generalized abdominal pain: Secondary | ICD-10-CM

## 2022-10-16 DIAGNOSIS — R634 Abnormal weight loss: Secondary | ICD-10-CM

## 2022-10-16 DIAGNOSIS — K644 Residual hemorrhoidal skin tags: Secondary | ICD-10-CM

## 2022-10-16 MED ORDER — PROMETHAZINE HCL 25 MG PO TABS: ORAL_TABLET | ORAL | 2 refills | Status: DC

## 2022-10-16 MED ORDER — HYOSCYAMINE SULFATE 0.125 MG PO TABS: 0.1250 mg | ORAL_TABLET | Freq: Three times a day (TID) | ORAL | 0 refills | Status: DC | PRN

## 2022-10-16 MED ORDER — HYOSCYAMINE SULFATE 0.125 MG PO TABS: 0.1250 mg | ORAL_TABLET | Freq: Four times a day (QID) | ORAL | 0 refills | Status: DC | PRN

## 2022-10-16 NOTE — Patient Instructions (Addendum)
Continue the pantoprazole and your Phenergan at your normal doses.  To help with the abdominal pain I would recommend that you use hyoscyamine up to 3 times a day as needed.  Please only use when abdominal pain is severe.  As we discussed this can contribute to worsening constipation.  For your constipation I want you to start taking MiraLAX 17 g once daily in 8 ounces of water or other fluid of your choice.  Some people prefer this mixed in 8 ounces of Gatorade.  You can for now continue to take the stool softener with it if you would like however if you or having too many looser stools with a combination then stop the stool softener and continue MiraLAX.  After couple weeks if you do not see a significant improvement in your bowel function or ability to feel more emptied then I want you to take it twice daily.  Continue your Prozac and clonidine and following with behavioral health.   If you do not have any significant improvement with MiraLAX or the Levsin it may be worth discussing the passing out/dizziness with your primary care to receive a workup for vertigo.  I am also ordering some blood work to repeat your blood counts, electrolytes, and reassess your thyroid.  Please have blood work completed at [facility].  We will call you with results once they have been received. Please allow 3-5 business days for review. 2 locations for [facility] in [place]:              1. [address], [place]              2. [address] [place], [place]   It was a pleasure to see you today. I want to create trusting relationships with patients. If you receive a survey regarding your visit,  I greatly appreciate you taking time to fill this out on paper or through your MyChart. I value your feedback.  Brooke Bonito, MSN, FNP-BC, AGACNP-BC [facility]

## 2022-10-16 NOTE — Telephone Encounter (Signed)
Nurses-it would be fine to help with referral to ENT for vertigo symptoms unfortunately now ENT with in [place] there is a [facility] in [place] thank you

## 2022-10-17 ENCOUNTER — Other Ambulatory Visit: Payer: Self-pay

## 2022-10-17 DIAGNOSIS — R42 Dizziness and giddiness: Secondary | ICD-10-CM

## 2022-10-24 LAB — COMPREHENSIVE METABOLIC PANEL
ALT: 30 IU/L (ref 0–44)
AST: 21 IU/L (ref 0–40)
Albumin: 4.7 g/dL (ref 4.3–5.2)
Alkaline Phosphatase: 65 IU/L (ref 51–125)
BUN/Creatinine Ratio: 9 (ref 9–20)
BUN: 7 mg/dL (ref 6–20)
Bilirubin Total: 0.5 mg/dL (ref 0.0–1.2)
CO2: 23 mmol/L (ref 20–29)
Calcium: 9.7 mg/dL (ref 8.7–10.2)
Chloride: 104 mmol/L (ref 96–106)
Creatinine, Ser: 0.79 mg/dL (ref 0.76–1.27)
Globulin, Total: 2.1 g/dL (ref 1.5–4.5)
Glucose: 93 mg/dL (ref 70–99)
Potassium: 4 mmol/L (ref 3.5–5.2)
Sodium: 144 mmol/L (ref 134–144)
Total Protein: 6.8 g/dL (ref 6.0–8.5)
eGFR: 131 mL/min/{1.73_m2} (ref >59)

## 2022-10-24 LAB — CBC
Hematocrit: 44.9 % (ref 37.5–51.0)
Hemoglobin: 15.4 g/dL (ref 13.0–17.7)
MCH: 30.6 pg (ref 26.6–33.0)
MCHC: 34.3 g/dL (ref 31.5–35.7)
MCV: 89 fL (ref 79–97)
Platelets: 285 10*3/uL (ref 150–450)
RBC: 5.03 x10E6/uL (ref 4.14–5.80)
RDW: 13.3 % (ref 11.6–15.4)
WBC: 6 10*3/uL (ref 3.4–10.8)

## 2022-10-24 LAB — TSH+FREE T4
Free T4: 1.23 ng/dL (ref 0.93–1.60)
TSH: 1.68 u[IU]/mL (ref 0.450–4.500)

## 2022-10-25 ENCOUNTER — Encounter: Payer: Self-pay | Admitting: *Deleted

## 2022-10-27 ENCOUNTER — Ambulatory Visit (INDEPENDENT_AMBULATORY_CARE_PROVIDER_SITE_OTHER): Payer: No Typology Code available for payment source | Admitting: Otolaryngology

## 2022-10-27 ENCOUNTER — Emergency Department (HOSPITAL_COMMUNITY): Payer: PRIVATE HEALTH INSURANCE

## 2022-10-27 ENCOUNTER — Other Ambulatory Visit: Payer: Self-pay

## 2022-10-27 ENCOUNTER — Encounter (INDEPENDENT_AMBULATORY_CARE_PROVIDER_SITE_OTHER): Payer: Self-pay | Admitting: Otolaryngology

## 2022-10-27 ENCOUNTER — Observation Stay (HOSPITAL_COMMUNITY)
Admission: EM | Admit: 2022-10-27 | Discharge: 2022-10-27 | Discharge status: Against Medical Advice | Payer: PRIVATE HEALTH INSURANCE | Attending: Internal Medicine | Admitting: Internal Medicine

## 2022-10-27 ENCOUNTER — Encounter (HOSPITAL_COMMUNITY): Payer: Self-pay | Admitting: Emergency Medicine

## 2022-10-27 VITALS — BP 123/82 | HR 76 | Ht 69.0 in | Wt 200.0 lb

## 2022-10-27 DIAGNOSIS — J45909 Unspecified asthma, uncomplicated: Secondary | ICD-10-CM | POA: Diagnosis not present

## 2022-10-27 DIAGNOSIS — R55 Syncope and collapse: Principal | ICD-10-CM | POA: Diagnosis present

## 2022-10-27 DIAGNOSIS — R112 Nausea with vomiting, unspecified: Secondary | ICD-10-CM

## 2022-10-27 DIAGNOSIS — R0981 Nasal congestion: Secondary | ICD-10-CM | POA: Diagnosis not present

## 2022-10-27 DIAGNOSIS — R519 Headache, unspecified: Secondary | ICD-10-CM

## 2022-10-27 DIAGNOSIS — F17210 Nicotine dependence, cigarettes, uncomplicated: Secondary | ICD-10-CM | POA: Diagnosis not present

## 2022-10-27 DIAGNOSIS — H9193 Unspecified hearing loss, bilateral: Secondary | ICD-10-CM | POA: Diagnosis not present

## 2022-10-27 DIAGNOSIS — H6993 Unspecified Eustachian tube disorder, bilateral: Secondary | ICD-10-CM

## 2022-10-27 DIAGNOSIS — J3089 Other allergic rhinitis: Secondary | ICD-10-CM | POA: Diagnosis not present

## 2022-10-27 DIAGNOSIS — H9313 Tinnitus, bilateral: Secondary | ICD-10-CM | POA: Diagnosis not present

## 2022-10-27 DIAGNOSIS — Z79899 Other long term (current) drug therapy: Secondary | ICD-10-CM | POA: Diagnosis not present

## 2022-10-27 LAB — URINALYSIS, ROUTINE W REFLEX MICROSCOPIC
Bacteria, UA: NONE SEEN
Bilirubin Urine: NEGATIVE
Glucose, UA: NEGATIVE mg/dL
Hgb urine dipstick: NEGATIVE
Ketones, ur: 80 mg/dL — AB
Leukocytes,Ua: NEGATIVE
Nitrite: NEGATIVE
Protein, ur: 30 mg/dL — AB
Specific Gravity, Urine: 1.014 (ref 1.005–1.030)
pH: 7 (ref 5.0–8.0)

## 2022-10-27 LAB — HEPATIC FUNCTION PANEL
ALT: 32 U/L (ref 0–44)
AST: 23 U/L (ref 15–41)
Albumin: 4.8 g/dL (ref 3.5–5.0)
Alkaline Phosphatase: 49 U/L (ref 38–126)
Bilirubin, Direct: 0.1 mg/dL (ref 0.0–0.2)
Indirect Bilirubin: 1.3 mg/dL — ABNORMAL HIGH (ref 0.3–0.9)
Total Bilirubin: 1.4 mg/dL — ABNORMAL HIGH (ref 0.3–1.2)
Total Protein: 7.9 g/dL (ref 6.5–8.1)

## 2022-10-27 LAB — RAPID URINE DRUG SCREEN, HOSP PERFORMED
Amphetamines: NOT DETECTED
Barbiturates: NOT DETECTED
Benzodiazepines: NOT DETECTED
Cocaine: NOT DETECTED
Opiates: NOT DETECTED
Tetrahydrocannabinol: POSITIVE — AB

## 2022-10-27 LAB — BASIC METABOLIC PANEL
Anion gap: 15 (ref 5–15)
BUN: 6 mg/dL (ref 6–20)
CO2: 18 mmol/L — ABNORMAL LOW (ref 22–32)
Calcium: 9.5 mg/dL (ref 8.9–10.3)
Chloride: 106 mmol/L (ref 98–111)
Creatinine, Ser: 0.85 mg/dL (ref 0.61–1.24)
GFR, Estimated: >60 mL/min (ref >60)
Glucose, Bld: 144 mg/dL — ABNORMAL HIGH (ref 70–99)
Potassium: 3.5 mmol/L (ref 3.5–5.1)
Sodium: 139 mmol/L (ref 135–145)

## 2022-10-27 LAB — TSH: TSH: 1.013 u[IU]/mL (ref 0.350–4.500)

## 2022-10-27 LAB — CBC
HCT: 40.9 % (ref 39.0–52.0)
Hemoglobin: 14.8 g/dL (ref 13.0–17.0)
MCH: 30.6 pg (ref 26.0–34.0)
MCHC: 36.2 g/dL — ABNORMAL HIGH (ref 30.0–36.0)
MCV: 84.7 fL (ref 80.0–100.0)
Platelets: 304 10*3/uL (ref 150–400)
RBC: 4.83 MIL/uL (ref 4.22–5.81)
RDW: 12.7 % (ref 11.5–15.5)
WBC: 7.2 10*3/uL (ref 4.0–10.5)
nRBC: 0 % (ref 0.0–0.2)

## 2022-10-27 LAB — CBG MONITORING, ED: Glucose-Capillary: 132 mg/dL — ABNORMAL HIGH (ref 70–99)

## 2022-10-27 LAB — TROPONIN I (HIGH SENSITIVITY)
Troponin I (High Sensitivity): 2 ng/L (ref <18)
Troponin I (High Sensitivity): 3 ng/L (ref <18)

## 2022-10-27 MED ORDER — ALUM & MAG HYDROXIDE-SIMETH 200-200-20 MG/5ML PO SUSP
30.0000 mL | Freq: Once | ORAL | Status: DC
  Filled 2022-10-27: qty 30

## 2022-10-27 MED ORDER — DIAZEPAM 5 MG/ML IJ SOLN
2.5000 mg | Freq: Once | INTRAMUSCULAR | Status: AC
  Administered 2022-10-27: 2.5 mg via INTRAVENOUS

## 2022-10-27 MED ORDER — PROMETHAZINE HCL 12.5 MG PO TABS: 12.5000 mg | ORAL_TABLET | Freq: Four times a day (QID) | ORAL | Status: DC | PRN

## 2022-10-27 MED ORDER — LACTATED RINGERS IV SOLN: INTRAVENOUS | Status: DC

## 2022-10-27 MED ORDER — LORAZEPAM 2 MG/ML IJ SOLN
0.5000 mg | Freq: Once | INTRAMUSCULAR | Status: AC
  Administered 2022-10-27: 0.5 mg via INTRAVENOUS

## 2022-10-27 MED ORDER — FLUTICASONE PROPIONATE 50 MCG/ACT NA SUSP
2.0000 | Freq: Every day | NASAL | 6 refills | Status: DC
Start: 2022-10-27 — End: 2023-09-22

## 2022-10-27 MED ORDER — LIDOCAINE VISCOUS HCL 2 % MT SOLN
15.0000 mL | Freq: Once | OROMUCOSAL | Status: DC
  Filled 2022-10-27: qty 15

## 2022-10-27 MED ORDER — IOHEXOL 300 MG/ML  SOLN
100.0000 mL | Freq: Once | INTRAMUSCULAR | Status: AC | PRN
  Administered 2022-10-27: 100 mL via INTRAVENOUS

## 2022-10-27 MED ORDER — ONDANSETRON HCL 4 MG/2ML IJ SOLN
4.0000 mg | Freq: Once | INTRAMUSCULAR | Status: AC
  Administered 2022-10-27: 4 mg via INTRAVENOUS

## 2022-10-27 MED ORDER — LACTATED RINGERS IV BOLUS
1000.0000 mL | Freq: Once | INTRAVENOUS | Status: AC
  Administered 2022-10-27: 1000 mL via INTRAVENOUS

## 2022-10-27 MED ORDER — FLUOXETINE HCL 20 MG PO CAPS: 20.0000 mg | ORAL_CAPSULE | Freq: Every day | ORAL | Status: DC

## 2022-10-27 MED ORDER — DROPERIDOL 2.5 MG/ML IJ SOLN
1.2500 mg | Freq: Once | INTRAMUSCULAR | Status: AC
  Administered 2022-10-27: 1.25 mg via INTRAVENOUS

## 2022-10-27 MED ORDER — MAGNESIUM SULFATE 2 GM/50ML IV SOLN
2.0000 g | Freq: Once | INTRAVENOUS | Status: AC
  Administered 2022-10-27: 2 g via INTRAVENOUS
  Filled 2022-10-27: qty 50

## 2022-10-27 MED ORDER — PROCHLORPERAZINE EDISYLATE 10 MG/2ML IJ SOLN
10.0000 mg | Freq: Once | INTRAMUSCULAR | Status: AC
  Administered 2022-10-27: 10 mg via INTRAVENOUS
  Filled 2022-10-27: qty 2

## 2022-10-27 MED ORDER — ENOXAPARIN SODIUM 40 MG/0.4ML IJ SOSY: 40.0000 mg | PREFILLED_SYRINGE | INTRAMUSCULAR | Status: DC

## 2022-10-27 NOTE — ED Provider Notes (Signed)
[place] [facility] DE[facility]MENT AT [facility] Provider Note  CSN: [id number] Arrival date & time: 10/27/22 1403  Chief Complaint(s) Near Syncope  HPI Duane Fleming is a 22 y.o. male with PMH anxiety, asthma, intractable nausea and vomiting following outpatient with gastroenterology, recurrent syncope who presents emergency department for evaluation of near syncope.  Patient reportedly has had multiple syncopal and presyncopal episodes since March 2024, and is unable to leave the house due to his severe symptoms.  He went to ENT today for vertigo workup and was ultimately discharged with referrals placed to cardiology and neurology.  Today he had significant near syncopal episode with diaphoresis, lightheadedness and persistent nausea and vomiting.  He states he does smoke delta 8 every day but has not smoked today.  Denies alcohol use or other coingestions.  Denies chest pain, abdominal pain, headache, fever or other systemic symptoms.   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 Authorizing Provider  albuterol (VENTOLIN HFA) 108 (90 Base) MCG/ACT inhaler Inhale 2 puffs into the lungs every 6 (six) hours as needed for wheezing or shortness of breath. 02/23/21    Mardella Layman, MD  cloNIDine (CATAPRES) 0.2 MG tablet Take 0.2 mg by mouth 2 (two) times daily as needed. 09/28/22   [provider]  FLUoxetine (PROZAC) 20 MG tablet TAKE 1 TABLET BY MOUTH EVERY DAY 10/02/22   Campbell Riches, NP  fluticasone (FLONASE) 50 MCG/ACT nasal spray Place 2 sprays into both nostrils daily. 10/27/22   Ashok Croon, MD  hydrocortisone (PROCTOSOL HC) 2.5 % rectal cream Place 1 Application rectally 2 (two) times daily. 08/30/22   Babs Sciara, MD  hyoscyamine (LEVSIN) 0.125 MG tablet Take 1 tablet (0.125 mg total) by mouth every 8 (eight) hours as needed. 10/16/22   Aida Raider, NP  ondansetron (ZOFRAN-ODT) 8 MG disintegrating tablet Take 1 tablet (8 mg total) by mouth every 8 (eight) hours as needed for nausea or vomiting. 08/14/22   Tommie Sams, DO  pantoprazole (PROTONIX) 40 MG tablet TAKE 1 TABLET (40 MG TOTAL) BY MOUTH TWICE A DAY BEFORE MEALS 03/27/22   Tressia Danas, MD  promethazine (PHENERGAN) 25 MG tablet 1/2 to 1 q 6 hours prn nausea 10/16/22   Aida Raider, NP  Past Surgical History Past Surgical History:  Procedure Laterality Date   ESOPHAGOGASTRODUODENOSCOPY     FRENULECTOMY, LINGUAL     TONSILLECTOMY AND ADENOIDECTOMY Bilateral 10/30/2016   Procedure: TONSILLECTOMY AND ADENOIDECTOMY;  Surgeon: Newman Pies, MD;  Location: [facility];  Service: ENT;  Laterality: Bilateral;   Family History Family History  Problem Relation Age of Onset   Asthma Sister    Hypertension Maternal Grandmother    Anesthesia problems Maternal Grandmother        anxiety when waking from anesthesia   Hypertension Maternal Grandfather    Esophageal cancer Neg Hx    Stomach cancer Neg Hx     Social History Social History   Tobacco Use   Smoking status: Every Day    Types: Cigarettes, E-cigarettes    Passive  exposure: Current   Smokeless tobacco: Never  Vaping Use   Vaping status: Every Day  Substance Use Topics   Alcohol use: No   Drug use: No   Allergies Keflex [cephalexin]  Review of Systems Review of Systems  Gastrointestinal:  Positive for nausea and vomiting.  Neurological:  Positive for syncope and light-headedness.    Physical Exam Vital Signs  I have reviewed the triage vital signs BP 131/87 (BP Location: Left Arm)   Pulse 67   Temp 98.2 F (36.8 C) (Oral)   Resp 17   Ht 5\' 9"  (1.753 m)   Wt 90.7 kg   SpO2 100%   BMI 29.53 kg/m   Physical Exam Constitutional:      General: He is not in acute distress.    Appearance: Normal appearance. He is ill-appearing and diaphoretic.  HENT:     Head: Normocephalic and atraumatic.     Nose: No congestion or rhinorrhea.  Eyes:     General:        Right eye: No discharge.        Left eye: No discharge.     Extraocular Movements: Extraocular movements intact.     Pupils: Pupils are equal, round, and reactive to light.  Cardiovascular:     Rate and Rhythm: Normal rate and regular rhythm.     Heart sounds: No murmur heard. Pulmonary:     Effort: No respiratory distress.     Breath sounds: No wheezing or rales.  Abdominal:     General: There is no distension.     Tenderness: There is no abdominal tenderness.  Musculoskeletal:        General: Normal range of motion.     Cervical back: Normal range of motion.  Skin:    General: Skin is warm.     Coloration: Skin is pale.  Neurological:     General: No focal deficit present.     Mental Status: He is alert.     ED Results and Treatments Labs (all labs ordered are listed, but only abnormal results are displayed) Labs Reviewed  CBC - Abnormal; Notable for the following components:      Result Value   MCHC 36.2 (*)    All other components within normal limits  CBG MONITORING, ED - Abnormal; Notable for the following components:   Glucose-Capillary 132 (*)    All  other components within normal limits  BASIC METABOLIC PANEL  URINALYSIS, ROUTINE W REFLEX MICROSCOPIC  Radiology No results found.  Pertinent labs & imaging results that were available during my care of the patient were reviewed by me and considered in my medical decision making (see MDM for details).  Medications Ordered in ED Medications  lactated ringers bolus 1,000 mL (has no administration in time range)                                                                                                                                     Procedures Procedures  (including critical care time)  Medical Decision Making / ED Course   This patient presents to the ED for concern of syncope, nausea, vomiting, this involves an extensive number of treatment options, and is a complaint that carries with it a high risk of complications and morbidity.  The differential diagnosis includes cyclic vomiting, obstruction, cholecystitis, delta 8 side effects, orthostatic syncope, cardiogenic syncope, vasovagal syncope, electrolyte abnormality, dehydration, dysrhythmia, vasovagal, Hypoglycemia, Seizure, Autonomic Insufficiency  MDM: Patient seen emergency room for evaluation of nausea, vomiting and syncope.  Physical exam reveals a pale, ill-appearing patient with mild epigastric tenderness palpation but is otherwise unremarkable.  Laboratory evaluation largely unremarkable with no significant leukocytosis, total bili 1.4, THC positive.  CT abdomen pelvis with some possible mild colitis but this does not fit with the patient's symptoms.  Patient continued to have persistent vomiting despite Zofran initiation and ultimately did receive droperidol and Ativan.  On reevaluation he had some mild improvement but failed p.o. challenge and will require hospital admission for intractable nausea  vomiting.  Patient admitted   Additional history obtained: -Additional history obtained from parents -External records from outside source obtained and reviewed including: Chart review including previous notes, labs, imaging, consultation notes   Lab Tests: -I ordered, reviewed, and interpreted labs.   The pertinent results include:   Labs Reviewed  CBC - Abnormal; Notable for the following components:      Result Value   MCHC 36.2 (*)    All other components within normal limits  CBG MONITORING, ED - Abnormal; Notable for the following components:   Glucose-Capillary 132 (*)    All other components within normal limits  BASIC METABOLIC PANEL  URINALYSIS, ROUTINE W REFLEX MICROSCOPIC      EKG   EKG Interpretation Date/Time:  Friday October 27 2022 14:18:37 EDT Ventricular Rate:  67 PR Interval:  148 QRS Duration:  94 QT Interval:  420 QTC Calculation: 443 R Axis:   19  Text Interpretation: Normal sinus rhythm Normal ECG When compared with ECG of 06-Dec-2021 09:36, PREVIOUS ECG IS PRESENT Confirmed by Janissa Bertram ([id number]) on 10/27/2022 3:08:47 PM         Imaging Studies ordered: I ordered imaging studies including CT abdomen pelvis I independently visualized and interpreted imaging. I agree with the radiologist interpretation   Medicines ordered and prescription drug management: Meds ordered this encounter  Medications   lactated ringers bolus  1,000 mL    -I have reviewed the patients home medicines and have made adjustments as needed  Critical interventions none    Cardiac Monitoring: The patient was maintained on a cardiac monitor.  I personally viewed and interpreted the cardiac monitored which showed an underlying rhythm of: NSR  Social Determinants of Health:  Factors impacting patients care include: Delta 8 use   Reevaluation: After the interventions noted above, I reevaluated the patient and found that they have :improved  Co morbidities that  complicate the patient evaluation  Past Medical History:  Diagnosis Date   Abrasion of leg 10/23/2016   Anxiety    Asthma    Chronic headache    Depression    Difficulty controlling anger    Family history of adverse reaction to anesthesia    maternal grandmother has issues with anxiety when waking from anesthesia   Tonsillar and adenoid hypertrophy 10/2016   mother unsure of apnea/snoring during sleep      Dispostion: I considered admission for this patient, and due to intractable nausea and vomiting patient require hospital admission     Final Clinical Impression(s) / ED Diagnoses Final diagnoses:  None     @PCDICTATION @    Glendora Score, MD 10/27/22 Paulo Fruit

## 2022-10-27 NOTE — Progress Notes (Signed)
Patient is leaving AMA, education provided, patient verbalized understanding of risks. Papers signed by patient. Family is at bedside. Dr. Dorthula Perfect notified of situation.

## 2022-10-27 NOTE — H&P (Addendum)
TR[facility] H&P   Patient Demographics:    Duane Fleming, is a 22 y.o. male  MRN: [id number]   DOB - [birth year]  Admit Date - 10/27/2022  Outpatient Primary MD for the patient is Babs Sciara, MD  Referring MD/NP/PA: Dr Posey Rea    Patient coming from: home  Chief Complaint  Patient presents with   Near Syncope      HPI:    Duane Fleming  is a 22 y.o. male, with past medical history of anxiety, asthma, intractable nausea and vomiting, followed by GI, presents to ED secondary to recurrent syncope, endorses vaping delta 8, father at bedside, mother by phone assists with the history, he has been suffering from syncope, near syncope since March 2024, was seen by GI guarding his nausea and vomiting, had extensive workup in the past including multiple imaging, autoimmune workup, endoscopy which has been negative, sent to ENT today for evaluation for vertigo, where he had referrals made for neurology and cardiology, and reports near syncope, significant nausea and vomiting, lightheadedness which prompted him to come to ED, denies any alcohol use, fever, any substance abuse besides his delta 8 vape. -In ED CT abdomen pelvis significant for thickening of ascending and transverse colon, likely due to poorly distended bowels, ET head with no acute finding, but given persistent nausea and vomiting despite multiple IV medication treatment [facility] consulted to admit.    Review of systems:    A full 10 point Review of Systems was done, except as stated above, all other Review of Systems were negative.   With Past History of the following :    Past Medical History:  Diagnosis Date   Abrasion of leg 10/23/2016   Anxiety    Asthma    Chronic headache    Depression    Difficulty controlling anger    Family history of adverse reaction to anesthesia    maternal grandmother has issues  with anxiety when waking from anesthesia   Tonsillar and adenoid hypertrophy 10/2016   mother unsure of apnea/snoring during sleep      Past Surgical History:  Procedure Laterality Date   ESOPHAGOGASTRODUODENOSCOPY     FRENULECTOMY, LINGUAL     TONSILLECTOMY AND ADENOIDECTOMY Bilateral 10/30/2016   Procedure: TONSILLECTOMY AND ADENOIDECTOMY;  Surgeon: Newman Pies, MD;  Location: [facility];  Service: ENT;  Laterality: Bilateral;      Social History:     Social History   Tobacco Use   Smoking status: Every Day    Types: Cigarettes, E-cigarettes    Passive exposure: Current   Smokeless tobacco: Never  Substance Use Topics   Alcohol use: No        Family History :     Family History  Problem Relation Age of Onset   Asthma Sister    Hypertension Maternal Grandmother    Anesthesia problems Maternal Grandmother  anxiety when waking from anesthesia   Hypertension Maternal Grandfather    Esophageal cancer Neg Hx    Stomach cancer Neg Hx      Home Medications:   Prior to Admission medications   Medication Sig Start Date End Date Taking? Authorizing Provider  albuterol (VENTOLIN HFA) 108 (90 Base) MCG/ACT inhaler Inhale 2 puffs into the lungs every 6 (six) hours as needed for wheezing or shortness of breath. 02/23/21   Mardella Layman, MD  cloNIDine (CATAPRES) 0.2 MG tablet Take 0.2 mg by mouth 2 (two) times daily as needed. 09/28/22   [provider]  FLUoxetine (PROZAC) 20 MG tablet TAKE 1 TABLET BY MOUTH EVERY DAY 10/02/22   Campbell Riches, NP  fluticasone (FLONASE) 50 MCG/ACT nasal spray Place 2 sprays into both nostrils daily. 10/27/22   Ashok Croon, MD  hydrocortisone (PROCTOSOL HC) 2.5 % rectal cream Place 1 Application rectally 2 (two) times daily. 08/30/22   Babs Sciara, MD  hyoscyamine (LEVSIN) 0.125 MG tablet Take 1 tablet (0.125 mg total) by mouth every 8 (eight) hours as needed. 10/16/22   Aida Raider, NP  ondansetron  (ZOFRAN-ODT) 8 MG disintegrating tablet Take 1 tablet (8 mg total) by mouth every 8 (eight) hours as needed for nausea or vomiting. 08/14/22   Tommie Sams, DO  pantoprazole (PROTONIX) 40 MG tablet TAKE 1 TABLET (40 MG TOTAL) BY MOUTH TWICE A DAY BEFORE MEALS 03/27/22   Tressia Danas, MD  promethazine (PHENERGAN) 25 MG tablet 1/2 to 1 q 6 hours prn nausea 10/16/22   Aida Raider, NP     Allergies:     Allergies  Allergen Reactions   Keflex [Cephalexin] Rash     Physical Exam:   Vitals  Blood pressure 128/82, pulse 87, temperature 98.5 F (36.9 C), temperature source Oral, resp. rate 14, height 5\' 9"  (1.753 m), weight 90.7 kg, SpO2 97%.   1. General developed Young male, lying in bed, sleepy, pale.  2. Normal affect and insight, Not Suicidal or Homicidal, Awake Alert, Oriented X 3.  3. No F.N deficits, ALL C.Nerves Intact, Strength 5/5 all 4 extremities, Sensation intact all 4 extremities, Plantars down going.  4. Ears and Eyes appear Normal, Conjunctivae clear, PERRLA. Moist Oral Mucosa.  5. Supple Neck, No JVD, No cervical lymphadenopathy appriciated, No Carotid Bruits.  6. Symmetrical Chest wall movement, Good air movement bilaterally, CTAB.  7. RRR, No Gallops, Rubs or Murmurs, No Parasternal Heave.  8. Positive Bowel Sounds, Abdomen Soft, mild tenderness to palpation tenderness, No organomegaly appriciated,No rebound -guarding or rigidity.  9.  No Cyanosis, Normal Skin Turgor, No Skin Rash or Bruise.  10. Good muscle tone,  joints appear normal , no effusions, Normal ROM.     Data Review:    CBC Recent Labs  Lab 10/23/22 0838 10/27/22 1454  WBC 6.0 7.2  HGB 15.4 14.8  HCT 44.9 40.9  PLT 285 304  MCV 89 84.7  MCH 30.6 30.6  MCHC 34.3 36.2*  RDW 13.3 12.7   ------------------------------------------------------------------------------------------------------------------  Chemistries  Recent Labs  Lab 10/23/22 0838 10/27/22 1454 10/27/22 1518   NA 144 139  --   K 4.0 3.5  --   CL 104 106  --   CO2 23 18*  --   GLUCOSE 93 144*  --   BUN 7 6  --   CREATININE 0.79 0.85  --   CALCIUM 9.7 9.5  --   AST 21  --  23  ALT 30  --  32  ALKPHOS 65  --  49  BILITOT 0.5  --  1.4*   ------------------------------------------------------------------------------------------------------------------ estimated creatinine clearance is 155.6 mL/min (by C-G formula based on SCr of 0.85 mg/dL). ------------------------------------------------------------------------------------------------------------------ Recent Labs    10/27/22 1547  TSH 1.013    Coagulation profile No results for input(s): "INR", "PROTIME" in the last 168 hours. ------------------------------------------------------------------------------------------------------------------- No results for input(s): "DDIMER" in the last 72 hours. -------------------------------------------------------------------------------------------------------------------  Cardiac Enzymes No results for input(s): "CKMB", "TROPONINI", "MYOGLOBIN" in the last 168 hours.  Invalid input(s): "CK" ------------------------------------------------------------------------------------------------------------------ No results found for: "BNP"   ---------------------------------------------------------------------------------------------------------------  Urinalysis    Component Value Date/Time   COLORURINE YELLOW 10/27/2022 1435   APPEARANCEUR CLEAR 10/27/2022 1435   LABSPEC 1.014 10/27/2022 1435   PHURINE 7.0 10/27/2022 1435   GLUCOSEU NEGATIVE 10/27/2022 1435   HGBUR NEGATIVE 10/27/2022 1435   BILIRUBINUR NEGATIVE 10/27/2022 1435   BILIRUBINUR negative 08/17/2022 1515   KETONESUR 80 (A) 10/27/2022 1435   PROTEINUR 30 (A) 10/27/2022 1435   UROBILINOGEN negative (A) 08/17/2022 1515   UROBILINOGEN 1.0 07/15/2007 1129   NITRITE NEGATIVE 10/27/2022 1435   LEUKOCYTESUR NEGATIVE 10/27/2022 1435     ----------------------------------------------------------------------------------------------------------------   Imaging Results:    CT Head Wo Contrast  Result Date: 10/27/2022 CLINICAL DATA:  Near syncope. EXAM: CT HEAD WITHOUT CONTRAST TECHNIQUE: Contiguous axial images were obtained from the base of the skull through the vertex without intravenous contrast. RADIATION DOSE REDUCTION: This exam was performed according to the departmental dose-optimization program which includes automated exposure control, adjustment of the mA and/or kV according to patient size and/or use of iterative reconstruction technique. COMPARISON:  None Available. FINDINGS: Brain: No evidence of acute infarction, hemorrhage, hydrocephalus, extra-axial collection or mass lesion/mass effect. Vascular: No hyperdense vessel or unexpected calcification. Skull: Normal. Negative for fracture or focal lesion. Sinuses/Orbits: No acute finding. Other: None. IMPRESSION: Normal head CT. Electronically Signed   By: Aram Candela M.D.   On: 10/27/2022 19:23   CT ABDOMEN PELVIS W CONTRAST  Result Date: 10/27/2022 CLINICAL DATA:  Epigastric pain and near syncope. EXAM: CT ABDOMEN AND PELVIS WITH CONTRAST TECHNIQUE: Multidetector CT imaging of the abdomen and pelvis was performed using the standard protocol following bolus administration of intravenous contrast. RADIATION DOSE REDUCTION: This exam was performed according to the departmental dose-optimization program which includes automated exposure control, adjustment of the mA and/or kV according to patient size and/or use of iterative reconstruction technique. CONTRAST:  OMNIPAQUE IOHEXOL 300 MG/ML  SOLN COMPARISON:  Aug 14, 2022 FINDINGS: Lower chest: No acute abnormality. Hepatobiliary: No focal liver abnormality is seen. No gallstones, gallbladder wall thickening, or biliary dilatation. Pancreas: Unremarkable. No pancreatic ductal dilatation or surrounding inflammatory  changes. Spleen: Normal in size without focal abnormality. Adrenals/Urinary Tract: Adrenal glands are unremarkable. Kidneys are normal, without renal calculi, focal lesion, or hydronephrosis. Bladder is unremarkable. Stomach/Bowel: Stomach is within normal limits. Appendix appears normal. No evidence of bowel dilatation. The ascending and transverse colon are poorly distended with mild diffuse colonic wall thickening. Vascular/Lymphatic: No significant vascular findings are present. No enlarged abdominal or pelvic lymph nodes. Reproductive: Prostate is unremarkable. Other: No abdominal wall hernia or abnormality. No abdominopelvic ascites. Musculoskeletal: No acute or significant osseous findings. IMPRESSION: Mild thickening of the ascending and transverse colon which is likely, in part, secondary to poorly distended bowel. A superimposed component of infectious or inflammatory colitis cannot be excluded. Electronically Signed   By: Aram Candela M.D.   On: 10/27/2022 17:55  EKG:  Vent. rate 66 BPM PR interval 142 ms QRS duration 93 ms QT/QTcB 431/452 ms P-R-T axes 9 12 23  Sinus arrhythmia Minimal ST depression, inferior leads   Assessment & Plan:    Principal Problem:   Near syncope Active Problems:   Intractable nausea and vomiting    Near syncope -Patient presents with near syncope, recurrent events, this is most likely vasovagal in the setting of his significant nausea and vomiting -Monitor on telemetry. -Will obtain 2D echo -Resolved level recently obtained in May which is appropriate at 21, TSH within normal limit -Continue with IV fluids and check orthostatics this -Discussed with father to keep the cardiology and neurology referral which has been made by ENT and to follow with them outpatient as well.  Intractable nausea and vomiting -I think this is most likely related to [facility] hyperemesis syndrome, he had extensive workup by GI in the past -Counseled patient at length to  stop vaping delta 8 -Drug screen positive for [facility] -CT abdomen pelvis obtained in ED, was reviewed, no concerning findings  DVT Prophylaxis Lovenox AM Labs Ordered, also please review Full Orders  Family Communication: Admission, patients condition and plan of care including tests being ordered have been discussed with the patient and father at bedside, and mother by phone who indicate understanding and agree with the plan and Code Status.  Code Status full code  Likely DC to home  Condition GUARDED  Consults called: None  Admission status: Observation  Time spent in minutes : 60 minutes   Huey Bienenstock M.D on 10/27/2022 at 8:08 PM   [facility] - Office  [phone]

## 2022-10-27 NOTE — Progress Notes (Signed)
ENT CONSULT:  Reason for Consult: passing out and heavy ears and getting hot, headache  x 3-4 months   HPI: Duane Fleming is an 22 y.o. male with hx of cannabis hyper-emesis sy, hx of chronic abdominal pain, nausea and vomiting, who based on record review had extensive workup with GI which did not identify a specific diagnosis who is here today for initial evaluation with me.   He states that he is here for episodes of dizziness and passing out. He feels his sx are associated with headaches and sensation of ear pressure. He states this has been going on for a few months. He had cerumen impaction in April , had his ears flushed at the PCP's office x 2, then developed an ear infection.   He reports muffled hearing and b/l ear ringing. He also reports constant b/l ear discomfort, heavy/pressure like sensation, no otorrhea. Never had hearing test, no ear surgeries. He had tonsils and adenoids removed in elementary school.   No sinus infections in the past. He reports stuffy nose and rhinorrhea. He reports periorbital/retro-orbital headache that are constant, has been present for years. He is not on nasal spray or antihistamine. He has sensation of vision blacking out and he feels dizzy frequently.    April 2024 - had cerumen impaction x 2 Had infection and did not take ear infections Nausea, vomiting headache.   Records Reviewed:  09/12/2022 Duane Fleming is a 22 y.o. male presenting today at the request of Luking, Jonna Coup, MD for nausea/vomiting.    Prior workup for fatty liver in 2021 which included elevated liver enzymes but with normal ANA, ceruloplasmin, ferritin, negative for hepatitis B and hepatitis A.  A1c also normal.  Lipid panel did show elevated cholesterol as well as triglycerides.  Endoscopy recommended at that time as well but patient decided to not pursue.   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   Previously  seen by Dr. Orvan Falconer at [facility].  Last office visit 02/22/2022.  Reported a 25 pound weight loss with associated anorexia.  Also having intermittent diarrhea on top of nausea and vomiting.  Impression from previous visit reported negative serologic workup for celiac and H. pylori negative.  Broad differentials including reflux, gastritis, and cannabis hyperemesis syndrome.  Also advised to the manifestation of anxiety.  Reported frequent marijuana use for many years.  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  Ultrasound in 2021 showing fatty liver.  Advise effective treatment for anxiety may ultimately improve his GI symptoms.  Plan was for pantoprazole 40 mg daily and famotidine 20 mg twice daily.  Advised to schedule EGD.  Discussed marijuana as possible cause of symptoms and recommended trial 4 weeks without smoking to see if symptoms improve.  Advised to consider cross-sectional imaging and EGD and biopsies nondiagnostic.  He previously expressed that he did not feel as though marijuana was the issue given he was smoking for many years and his symptoms have began more recently prior to his visit.  Not interested in counseling or medication for stress, anxiety, or depression.  No known family history of colon cancer or polyps or any other GI malignancy.  Sister does have EOE and food allergies and currently undergoing evaluation for IBD.    Past Medical History:  Diagnosis Date   Abrasion of leg 10/23/2016   Anxiety    Asthma    Chronic  headache    Depression    Difficulty controlling anger    Family history of adverse reaction to anesthesia    maternal grandmother has issues with anxiety when waking from anesthesia   Tonsillar and adenoid hypertrophy 10/2016   mother unsure of apnea/snoring during sleep    Past Surgical History:  Procedure Laterality Date   ESOPHAGOGASTRODUODENOSCOPY     FRENULECTOMY, LINGUAL      TONSILLECTOMY AND ADENOIDECTOMY Bilateral 10/30/2016   Procedure: TONSILLECTOMY AND ADENOIDECTOMY;  Surgeon: Newman Pies, MD;  Location: [facility];  Service: ENT;  Laterality: Bilateral;    Family History  Problem Relation Age of Onset   Asthma Sister    Hypertension Maternal Grandmother    Anesthesia problems Maternal Grandmother        anxiety when waking from anesthesia   Hypertension Maternal Grandfather    Esophageal cancer Neg Hx    Stomach cancer Neg Hx     Social History:  reports that he has been smoking cigarettes and e-cigarettes. He has been exposed to tobacco smoke. He has never used smokeless tobacco. He reports that he does not drink alcohol and does not use drugs.  Allergies:  Allergies  Allergen Reactions   Keflex [Cephalexin] Rash    Medications: I have reviewed the patient's current medications.  No results found for this or any previous visit (from the past 48 hour(s)).  No results found.  The PMH, PSH, Medications, Allergies, and SH were reviewed and updated.  ROS: Constitutional: Negative for fever, weight loss and weight gain. Cardiovascular: Negative for chest pain and dyspnea on exertion. Respiratory: Is not experiencing shortness of breath at rest. Gastrointestinal:(+) nausea and vomiting. Neurological: Negative for headaches. Psychiatric: The patient is not nervous/anxious  Blood pressure 123/82, pulse 76, height 5\' 9"  (1.753 m), weight 200 lb (90.7 kg), SpO2 98%.  PHYSICAL EXAM:  Exam: General: Well-developed, well-nourished Respiratory Respiratory effort: Equal inspiration and expiration without stridor Cardiovascular Peripheral Vascular: Warm extremities with equal color/perfusion Eyes: No nystagmus with equal extraocular motion bilaterally Neuro/Psych/Balance: Patient oriented to person, place, and time; Appropriate mood and affect; Gait is intact with no imbalance; Cranial nerves I-XII are intact. No nystagmus, able to  walk Head and Face Inspection: Normocephalic and atraumatic without mass or lesion Palpation: Facial skeleton intact without bony stepoffs Salivary Glands: No mass or tenderness Facial Strength: Facial motility symmetric and full bilaterally ENT Pinna: External ear intact and fully developed External canal: Canal is patent with intact skin Tympanic Membrane: Clear and mobile External Nose: No scar or anatomic deformity Internal Nose: Septum intact and midline. No edema, polyp, or rhinorrhea Lips, Teeth, and gums: Mucosa and teeth intact and viable TMJ: No pain to palpation with full mobility Oral cavity/oropharynx: No erythema or exudate, no lesions present Nasopharynx: No mass or lesion with intact mucosa Hypopharynx: Intact mucosa without pooling of secretions Larynx Glottic: Full true vocal cord mobility without lesion or mass Supraglottic: Normal appearing epiglottis and AE folds Interarytenoid Space: No or minimal pachydermia or edema Subglottic Space: Patent without lesion or edema Neck Neck and Trachea: Midline trachea without mass or lesion Thyroid: No mass or nodularity Lymphatics: No lymphadenopathy   PROCEDURE NOTE: nasal endoscopy  Preoperative diagnosis: chronic sinusitis symptoms  Postoperative diagnosis: same  Procedure: Diagnostic nasal endoscopy (78295)  Surgeon: Ashok Croon, M.D.  Anesthesia: Topical lidocaine and Afrin  H&P REVIEW: The patient's history and physical were reviewed today prior to procedure. All medications were reviewed and updated as well. Complications: None  Condition is stable throughout exam Indications and consent: The patient presents with symptoms of chronic sinusitis not responding to previous therapies. All the risks, benefits, and potential complications were reviewed with the patient preoperatively and informed consent was obtained. The time out was completed with confirmation of the correct procedure.   Procedure: The  patient was seated upright in the clinic. Topical lidocaine and Afrin were applied to the nasal cavity. After adequate anesthesia had occurred, the rigid nasal endoscope was passed into the nasal cavity. The nasal mucosa, turbinates, septum, and sinus drainage pathways were visualized bilaterally. This revealed no purulence or significant secretions that might be cultured. There were no polyps or sites of significant inflammation. The mucosa was intact and there no crusting present. The scope was then slowly withdrawn and the patient tolerated the procedure well. There were no complications or blood loss.   Studies Reviewed: CT A/P 08/14/2022 IMPRESSION: 1. No acute intra-abdominal pathology identified. No definite radiographic explanation for the patient's reported symptoms. 2. Tiny fat containing right inguinal hernia.  08/24/2022 EXAM: ULTRASOUND ABDOMEN LIMITED RIGHT UPPER QUADRANT   COMPARISON:  CT of the abdomen and pelvis 08/14/2022, ultrasound of the abdomen 12/11/2019   FINDINGS: Gallbladder:   Gallbladder has a normal appearance. Gallbladder wall is 1.8 millimeters, within normal limits. No stones or pericholecystic fluid. No sonographic Murphy's sign.   Common bile duct:   Diameter: 3.4 millimeters. No intrahepatic biliary duct dilatation.   Liver:   No focal lesion identified. Within normal limits in parenchymal echogenicity. Portal vein is patent on color Doppler imaging with normal direction of blood flow towards the liver.   Other: None.   IMPRESSION: Normal evaluation of the RIGHT UPPER QUADRANT.  Assessment/Plan: Encounter Diagnoses  Name Primary?   Bilateral hearing loss, unspecified hearing loss type    Tinnitus of both ears    Syncope, unspecified syncope type Yes   Dysfunction of both eustachian tubes    Intractable nausea and vomiting    Nonintractable headache, unspecified chronicity pattern, unspecified headache type    Nasal congestion     Environmental and seasonal allergies      - I discussed that his sx have broad differential and are likely not related to ENT source, we will place a referral to see Neurology and Cardiology for dizziness and sensation of black outs and near-syncopal episodes, already saw GI for abdominal pain and N/V - I suspect nasal congestion and obstruction in the setting of seasonal or environmental allergies - currently not treated - will initiate on medical management  - CT sinuses was offered to evaluate for chronic sinus inflammation, but the patient would like to hold off for now  - I believe that if the cause of his headaches is related to ENT source, his headaches could be 2/2 nasal congestion in the setting of allergies vs chronic sinusitis - there was no evidence of purulence or polyps on nasal endoscopy today - will initiate antihistamine and Flonase - patient deferred CT sinuses  - he would like to hold off on CT or MRI/MRA which I offered due to persistent and unusual nature of his sx  - refer to Dr Avel Sensor office for vestibular testing and Audiogram - to evaluate hearing and tinnitus  - RTC after testing   Thank you for allowing me to participate in the care of this patient. Please do not hesitate to contact me with any questions or concerns.   Ashok Croon, MD Otolaryngology [facility] Phone: [phone] Fax:  [phone]    10/27/2022, 11:41 AM

## 2022-10-27 NOTE — ED Triage Notes (Addendum)
Pt via [facility] c/o near syncope. Pt was very pale, clammy, and diaphoretic on EMS arrival, and pt was retching continuously on scene. Pt also noted that his hands and arms were numb at the time.  Pt was at ENT this morning and was scoped, and he was advised to follow up with cardiology for similar symptoms reported in office earlier today. Pt denies pain. Admits to smoking Delta 8 last night.   BP 126/78 HR 82, NSR CBG 113 O2 99% RA  20ga in left hand with 4mg  zofran given en route PTA.   Pt remains very pale and sweaty in triage and states he still feels nauseated despite zofran given en route. Vitals WNL.

## 2022-10-30 ENCOUNTER — Telehealth: Payer: Self-pay

## 2022-10-30 ENCOUNTER — Encounter: Payer: Self-pay | Admitting: Family Medicine

## 2022-10-30 NOTE — Transitions of Care (Post Inpatient/ED Visit) (Signed)
   10/30/2022  Name: Duane Fleming MRN: [id number] DOB: 2001/01/28  Today's TOC FU Call Status: Today's TOC FU Call Status:: Successful TOC FU Call Competed TOC FU Call Complete Date: 10/30/22  Transition Care Management Follow-up Telephone Call Date of Discharge: 10/27/22 [name] Type of Discharge: Inpatient Admission Primary Inpatient Discharge Diagnosis:: syncope How have you been since you were released from the hospital?: Better Any questions or concerns?: No  Items Reviewed: Did you receive and understand the discharge instructions provided?: Yes Medications obtained,verified, and reconciled?: Yes (Medications Reviewed) Any new allergies since your discharge?: No Dietary orders reviewed?: Yes Do you have support at home?: Yes People in Home: parent(s)  Medications Reviewed Today: Medications Reviewed Today     Reviewed by Karena Addison, LPN (Licensed Practical Nurse) on 10/30/22 at 1522  Med List Status: <None>   Medication Order Taking? Sig Documenting Provider Last Dose Status Informant  albuterol (VENTOLIN HFA) 108 (90 Base) MCG/ACT inhaler [id number] No Inhale 2 puffs into the lungs every 6 (six) hours as needed for wheezing or shortness of breath. Mardella Layman, MD Taking Active   cloNIDine (CATAPRES) 0.2 MG tablet [id number] No Take 0.2 mg by mouth 2 (two) times daily as needed. [provider] Taking Active   FLUoxetine (PROZAC) 20 MG tablet [id number] No TAKE 1 TABLET BY MOUTH EVERY DAY Campbell Riches, NP Taking Active   fluticasone (FLONASE) 50 MCG/ACT nasal spray [id number]  Place 2 sprays into both nostrils daily. Ashok Croon, MD  Active   hydrocortisone (PROCTOSOL HC) 2.5 % rectal cream [id number] No Place 1 Application rectally 2 (two) times daily. Babs Sciara, MD Taking Active   hyoscyamine (LEVSIN) 0.125 MG tablet [id number] No Take 1 tablet (0.125 mg total) by mouth every 8 (eight) hours as needed. Aida Raider, NP Taking Active   ondansetron (ZOFRAN-ODT) 8 MG disintegrating tablet [id number] No Take 1 tablet (8 mg total) by mouth every 8 (eight) hours as needed for nausea or vomiting. [name], DO Taking Active   pantoprazole (PROTONIX) 40 MG tablet [id number] No TAKE 1 TABLET (40 MG TOTAL) BY MOUTH TWICE A DAY BEFORE MEALS Tressia Danas, MD Taking Active   promethazine (PHENERGAN) 25 MG tablet [id number] No 1/2 to 1 q 6 hours prn nausea Aida Raider, NP Taking Active             Home Care and Equipment/Supplies: Were Home Health Services Ordered?: NA Any new equipment or medical supplies ordered?: NA  Functional Questionnaire: Do you need assistance with bathing/showering or dressing?: No Do you need assistance with meal preparation?: No Do you need assistance with eating?: No Do you have difficulty maintaining continence: No Do you need assistance with getting out of bed/getting out of a chair/moving?: No Do you have difficulty managing or taking your medications?: No  Follow up appointments reviewed: PCP Follow-up appointment confirmed?: NA Specialist Hospital Follow-up appointment confirmed?: Yes Date of Specialist follow-up appointment?: 10/31/22 Follow-Up Specialty Provider:: neuro Do you need transportation to your follow-up appointment?: No Do you understand care options if your condition(s) worsen?: Yes-patient verbalized understanding    SIGNATURE Karena Addison, LPN [facility] Nurse Health Advisor Direct Dial [phone]

## 2022-10-30 NOTE — Telephone Encounter (Signed)
Nurses I was able to read over the hospital notes They referred him to neurology and cardiology I do not see where at this point either specialty has set him up for office visit Often it can take several days If they do not hear anything within the next 7 days he should let us know.

## 2022-11-01 ENCOUNTER — Encounter: Payer: Self-pay | Admitting: Neurology

## 2022-11-01 ENCOUNTER — Telehealth: Payer: Self-pay | Admitting: Neurology

## 2022-11-01 ENCOUNTER — Ambulatory Visit: Payer: No Typology Code available for payment source | Admitting: Neurology

## 2022-11-01 ENCOUNTER — Encounter: Payer: Self-pay | Admitting: Family Medicine

## 2022-11-01 DIAGNOSIS — Z029 Encounter for administrative examinations, unspecified: Secondary | ICD-10-CM

## 2022-11-01 NOTE — Telephone Encounter (Signed)
Patient's mother just called, Patient was on the way to appointment, Patient started feeling like he was going to pass out. Patient is going to emergency room/[facility]

## 2022-11-02 NOTE — Telephone Encounter (Signed)
Nurses The referral has been placed to cardiology by the ER on 719.  It appears he has an appointment toward the end of September-cardiology like many other specialties are dealing with a lack of providers and large number of requested visits therefore it is very difficult for them to see patients quickly  As for suspected eating disorder serotonin reuptake inhibitor such as Prozac can help.  But also counseling with psychologist is the general approach.  Mom or Chaynce will need to give [place] feedback if he is currently seeing a counselor  According to my chart it looks like he has an appointment with cardiology toward the end of September.  If they would like to follow-up with [place] please let us know.  Unfortunately our office as well as experiencing tight schedule is but we will do the best we can

## 2022-11-06 ENCOUNTER — Encounter: Payer: Self-pay | Admitting: *Deleted

## 2022-11-21 ENCOUNTER — Ambulatory Visit: Payer: No Typology Code available for payment source | Admitting: Audiologist

## 2022-11-27 ENCOUNTER — Ambulatory Visit (INDEPENDENT_AMBULATORY_CARE_PROVIDER_SITE_OTHER): Payer: No Typology Code available for payment source | Admitting: Otolaryngology

## 2022-12-04 ENCOUNTER — Other Ambulatory Visit: Payer: Self-pay | Admitting: Gastroenterology

## 2022-12-04 MED ORDER — PROMETHAZINE HCL 25 MG PO TABS: ORAL_TABLET | ORAL | 2 refills | Status: AC

## 2023-01-04 ENCOUNTER — Ambulatory Visit: Payer: No Typology Code available for payment source | Attending: Internal Medicine | Admitting: Internal Medicine

## 2023-01-04 ENCOUNTER — Ambulatory Visit: Payer: No Typology Code available for payment source

## 2023-01-04 ENCOUNTER — Encounter: Payer: Self-pay | Admitting: Internal Medicine

## 2023-01-04 VITALS — BP 114/70 | HR 86 | Ht 70.0 in | Wt 216.8 lb

## 2023-01-04 DIAGNOSIS — R55 Syncope and collapse: Secondary | ICD-10-CM

## 2023-01-04 DIAGNOSIS — R42 Dizziness and giddiness: Secondary | ICD-10-CM

## 2023-01-04 NOTE — Progress Notes (Signed)
Cardiology Office Note  Date: 01/04/2023   ID: Duane Fleming, DOB Jul 29, 2000, MRN [id number]  PCP:  Babs Sciara, MD  Cardiologist:  None Electrophysiologist:  None   History of Present Illness: Duane Fleming is a 22 y.o. male known to have severe anxiety.  Cardiology clinic for evaluation of syncope.  Patient had syncope in 3/24 when he was at a friend's house without any prodrome.  He fell with his face forward.  Since then, he started to have daily dizziness especially when he lays on his back and sometimes with exertion.  He also has presyncopal episodes almost daily.  He says he has a lot of stress in his life, does not have a good relationship with his father and due to which he is under a lot of stress according to him and his mother.  He has severe anxiety, follows with psychiatrist.  After starting Zyprexa, dizziness improved.  He is currently staying at home and is worried to going out due to the fear of presyncopal episodes.  During the anxiety attacks, he does have chest pains, SOB and palpitations.  Even with the presyncopal spells, he does have the symptoms.  No prior history of heart issues.  Past Medical History:  Diagnosis Date   Abrasion of leg 10/23/2016   Anxiety    Asthma    Chronic headache    Depression    Difficulty controlling anger    Family history of adverse reaction to anesthesia    maternal grandmother has issues with anxiety when waking from anesthesia   Tonsillar and adenoid hypertrophy 10/2016   mother unsure of apnea/snoring during sleep    Past Surgical History:  Procedure Laterality Date   ESOPHAGOGASTRODUODENOSCOPY     FRENULECTOMY, LINGUAL     TONSILLECTOMY AND ADENOIDECTOMY Bilateral 10/30/2016   Procedure: TONSILLECTOMY AND ADENOIDECTOMY;  Surgeon: Newman Pies, MD;  Location: [facility];  Service: ENT;  Laterality: Bilateral;    Current Outpatient Medications  Medication Sig Dispense Refill   albuterol (VENTOLIN HFA)  108 (90 Base) MCG/ACT inhaler Inhale 2 puffs into the lungs every 6 (six) hours as needed for wheezing or shortness of breath. 18 g 1   cloNIDine (CATAPRES) 0.2 MG tablet Take 0.2 mg by mouth 2 (two) times daily as needed.     FLUoxetine (PROZAC) 40 MG capsule Take 40 mg by mouth every morning.     OLANZapine (ZYPREXA) 5 MG tablet 5 mg daily.     ondansetron (ZOFRAN-ODT) 8 MG disintegrating tablet Take 1 tablet (8 mg total) by mouth every 8 (eight) hours as needed for nausea or vomiting. 20 tablet 3   pantoprazole (PROTONIX) 40 MG tablet TAKE 1 TABLET (40 MG TOTAL) BY MOUTH TWICE A DAY BEFORE MEALS 180 tablet 1   promethazine (PHENERGAN) 25 MG tablet 1/2 to 1 q 6 hours prn nausea 25 tablet 2   fluticasone (FLONASE) 50 MCG/ACT nasal spray Place 2 sprays into both nostrils daily. (Patient not taking: Reported on 01/04/2023) 16 g 6   hydrocortisone (PROCTOSOL HC) 2.5 % rectal cream Place 1 Application rectally 2 (two) times daily. (Patient not taking: Reported on 01/04/2023) 30 g 3   hyoscyamine (LEVSIN) 0.125 MG tablet Take 1 tablet (0.125 mg total) by mouth every 8 (eight) hours as needed. (Patient not taking: Reported on 01/04/2023) 30 tablet 0   No current facility-administered medications for this visit.   Allergies:  Keflex [cephalexin]   Social History: The patient  reports that  he has quit smoking. His smoking use included cigarettes and e-cigarettes. He has been exposed to tobacco smoke. He has never used smokeless tobacco. He reports that he does not drink alcohol and does not use drugs.   Family History: The patient's family history includes Anesthesia problems in his maternal grandmother; Asthma in his sister; Hypertension in his maternal grandfather and maternal grandmother.   ROS:  Please see the history of present illness. Otherwise, complete review of systems is positive for none.  All other systems are reviewed and negative.   Physical Exam: VS:  BP 114/70   Pulse 86   Ht 5\' 10"   (1.778 m)   Wt 216 lb 12.8 oz (98.3 kg)   SpO2 99%   BMI 31.11 kg/m , BMI Body mass index is 31.11 kg/m.  Wt Readings from Last 3 Encounters:  01/04/23 216 lb 12.8 oz (98.3 kg) (96%, Z= 1.79)*  10/27/22 204 lb 4.8 oz (92.7 kg) (94%, Z= 1.53)*  10/27/22 200 lb (90.7 kg) (92%, Z= 1.42)*   * Growth percentiles are based on CDC (Boys, 2-20 Years) data.    General: Patient appears comfortable at rest. HEENT: Conjunctiva and lids normal, oropharynx clear with moist mucosa. Neck: Supple, no elevated JVP or carotid bruits, no thyromegaly. Lungs: Clear to auscultation, nonlabored breathing at rest. Cardiac: Regular rate and rhythm, no S3 or significant systolic murmur, no pericardial rub. Abdomen: Soft, nontender, no hepatomegaly, bowel sounds present, no guarding or rebound. Extremities: No pitting edema, distal pulses 2+. Skin: Warm and dry. Musculoskeletal: No kyphosis. Neuropsychiatric: Alert and oriented x3, affect grossly appropriate.  Recent Labwork: 10/27/2022: ALT 32; AST 23; BUN 6; Creatinine, Ser 0.85; Hemoglobin 14.8; Platelets 304; Potassium 3.5; Sodium 139; TSH 1.013     Component Value Date/Time   CHOL 204 (H) 02/05/2020 0833   TRIG 117 (H) 02/05/2020 0833   HDL 56 02/05/2020 0833   CHOLHDL 3.6 02/05/2020 0833   LDLCALC 127 (H) 02/05/2020 0833     Assessment and Plan:  Dizziness Pre-syncope Syncope Severe anxiety  -Syncope in 3/24 with no recurrence, has daily dizziness and presyncopal episodes. Stays in house and has fear of going out due to presyncopal episodes. Has severe anxiety, follows up with psychiatrist, dizziness improved after starting Zyprexa. EKG from 7/24 showed normal sinus rhythm, no preexcitation and normal QTc interval. No family history of sudden cardiac death.  I believe his symptoms could likely be secondary to severe anxiety. Obtain echocardiogram structural heart disease, 2-week event monitor blood conduction system abnormalities and exercise  tolerance test to rule out exertional arrhythmias.  Discussed management of severe anxiety with medical management and meditation.    Medication Adjustments/Labs and Tests Ordered: Current medicines are reviewed at length with the patient today.  Concerns regarding medicines are outlined above.    Disposition:  Follow up pending results  Signed, Hadia Minier Verne Spurr, MD, 01/04/2023 9:53 AM    [facility] at [facility] [address], [place], [place] [address]

## 2023-01-04 NOTE — Patient Instructions (Signed)
Medication Instructions:  Your physician recommends that you continue on your current medications as directed. Please refer to the Current Medication list given to you today.  *If you need a refill on your cardiac medications before your next appointment, please call your pharmacy*   Lab Work: None If you have labs (blood work) drawn today and your tests are completely normal, you will receive your results only by: MyChart Message (if you have MyChart) OR A paper copy in the mail If you have any lab test that is abnormal or we need to change your treatment, we will call you to review the results.   Testing/Procedures: Your physician has requested that you have an echocardiogram. Echocardiography is a painless test that uses sound waves to create images of your heart. It provides your doctor with information about the size and shape of your heart and how well your heart's chambers and valves are working. This procedure takes approximately one hour. There are no restrictions for this procedure. Please do NOT wear cologne, perfume, aftershave, or lotions (deodorant is allowed). Please arrive 15 minutes prior to your appointment time.  Your physician has requested that you have an exercise tolerance test. For further information please visit [email or web address] Please also follow instruction sheet, as given.    Follow-Up: At [facility], you and your health needs are our priority.  As part of our continuing mission to provide you with exceptional heart care, we have created designated Provider Care Teams.  These Care Teams include your primary Cardiologist (physician) and Advanced Practice Providers (APPs -  Physician Assistants and Nurse Practitioners) who all work together to provide you with the care you need, when you need it.  We recommend signing up for the patient portal called "MyChart".  Sign up information is provided on this After Visit Summary.  MyChart is used to connect  with patients for Virtual Visits (Telemedicine).  Patients are able to view lab/test results, encounter notes, upcoming appointments, etc.  Non-urgent messages can be sent to your provider as well.   To learn more about what you can do with MyChart, go to [email or web address].    Your next appointment:    Pending Results   Provider:    Luane School, MD  Other Instructions

## 2023-01-08 DIAGNOSIS — R55 Syncope and collapse: Secondary | ICD-10-CM

## 2023-01-08 DIAGNOSIS — R42 Dizziness and giddiness: Secondary | ICD-10-CM

## 2023-02-09 ENCOUNTER — Ambulatory Visit (HOSPITAL_BASED_OUTPATIENT_CLINIC_OR_DEPARTMENT_OTHER)
Admission: RE | Admit: 2023-02-09 | Discharge: 2023-02-09 | Disposition: A | Discharge status: Home / Self Care | Payer: No Typology Code available for payment source | Source: Ambulatory Visit | Attending: Family Medicine | Admitting: Family Medicine

## 2023-02-09 ENCOUNTER — Ambulatory Visit (HOSPITAL_COMMUNITY)
Admission: RE | Admit: 2023-02-09 | Discharge: 2023-02-09 | Disposition: A | Discharge status: Home / Self Care | Payer: No Typology Code available for payment source | Source: Ambulatory Visit | Attending: Internal Medicine | Admitting: Internal Medicine

## 2023-02-09 DIAGNOSIS — R42 Dizziness and giddiness: Secondary | ICD-10-CM | POA: Diagnosis present

## 2023-02-09 DIAGNOSIS — R55 Syncope and collapse: Secondary | ICD-10-CM | POA: Insufficient documentation

## 2023-02-09 LAB — EXERCISE TOLERANCE TEST
Angina Index: 1
Base ST Depression (mm): 0 mm
Duke Treadmill Score: 4
Estimated workload: 10.1
Exercise duration (min): 7 min
Exercise duration (sec): 46 s
MPHR: 201 {beats}/min
Peak HR: 157 {beats}/min
Percent HR: 78 %
RPE: 19
Rest HR: 90 {beats}/min
ST Depression (mm): 0 mm

## 2023-02-09 LAB — ECHOCARDIOGRAM COMPLETE
AR max vel: 3.11 cm2
AV Area VTI: 2.77 cm2
AV Area mean vel: 2.88 cm2
AV Mean grad: 7 mm[Hg]
AV Peak grad: 11.6 mm[Hg]
Ao pk vel: 1.7 m/s
Area-P 1/2: 4.6 cm2
Calc EF: 63.6 %
MV VTI: 3.08 cm2
S' Lateral: 3.1 cm
Single Plane A2C EF: 62.8 %
Single Plane A4C EF: 61.8 %

## 2023-09-21 ENCOUNTER — Encounter: Payer: Self-pay | Admitting: Family Medicine

## 2023-09-21 ENCOUNTER — Ambulatory Visit: Payer: Self-pay

## 2023-09-21 ENCOUNTER — Ambulatory Visit (INDEPENDENT_AMBULATORY_CARE_PROVIDER_SITE_OTHER): Admitting: Family Medicine

## 2023-09-21 VITALS — BP 133/87 | HR 107 | Temp 98.1°F | Ht 70.0 in | Wt 232.0 lb

## 2023-09-21 DIAGNOSIS — H6123 Impacted cerumen, bilateral: Secondary | ICD-10-CM | POA: Diagnosis not present

## 2023-09-21 DIAGNOSIS — H9202 Otalgia, left ear: Secondary | ICD-10-CM | POA: Diagnosis not present

## 2023-09-21 DIAGNOSIS — H65192 Other acute nonsuppurative otitis media, left ear: Secondary | ICD-10-CM

## 2023-09-21 MED ORDER — AMOXICILLIN 500 MG PO CAPS: 500.0000 mg | ORAL_CAPSULE | Freq: Three times a day (TID) | ORAL | 0 refills | Status: AC

## 2023-09-21 NOTE — Telephone Encounter (Signed)
 FYI Only or Action Required?: FYI only for provider  Patient was last seen in primary care on 09/11/2022 by Bennet Brasil, MD. Called Nurse Triage reporting Otalgia. Symptoms began several weeks ago. Interventions attempted: OTC medications: tylenol. Symptoms are: unchanged.  Triage Disposition: See HCP Within 4 Hours (Or PCP Triage)  Patient/caregiver understands and will follow disposition?: Yes           Copied from CRM [id number]. Topic: Clinical - Red Word Triage >> Sep 21, 2023  8:34 AM Zipporah Him wrote: Red Word that prompted transfer to Nurse Triage: Possible ear infection, left side. Been hurting a while and swelling around the ear. Reason for Disposition  [1] SEVERE pain AND [2] not improved 2 hours after taking analgesic medication (e.g., ibuprofen  or acetaminophen)  Answer Assessment - Initial Assessment Questions 1. LOCATION: Which ear is involved?       Both ear but left is worse 2. ONSET: When did the ear start hurting      A couple weeks 3. SEVERITY: How bad is the pain?  (Scale 1-10; mild, moderate or severe)   - MILD (1-3): doesn't interfere with normal activities    - MODERATE (4-7): interferes with normal activities or awakens from sleep    - SEVERE (8-10): excruciating pain, unable to do any normal activities      Mod-severe 4. URI SYMPTOMS: Do you have a runny nose or cough?     no 5. FEVER: Do you have a fever? If Yes, ask: What is your temperature, how was it measured, and when did it start?     no 6. CAUSE: Have you been swimming recently?, How often do you use Q-TIPS?, Have you had any recent air travel or scuba diving?     no 7. OTHER SYMPTOMS: Do you have any other symptoms? (e.g., headache, stiff neck, dizziness, vomiting, runny nose, decreased hearing)    Drainage from left ear  Protocols used: Earache-A-AH

## 2023-09-21 NOTE — Telephone Encounter (Signed)
 Appt scheduled

## 2023-09-21 NOTE — Progress Notes (Unsigned)
   Subjective:    Patient ID: Duane Fleming, male    DOB: 2000-07-10, 22 y.o.   MRN: [id number]  HPI Left ear soreness for 6 to 8 months , decreased hearing  Ear wax drainage  Patient relates both ears he is having difficult time hearing from left ear is giving him some pain and discomfort he denies any high fever chills or sweats denies any chest congestion or shortness of breath  Review of Systems     Objective:   Physical Exam  Bilateral cerumen impaction after irrigation the right side did not irrigate well the left side irrigated moderately but still had significant wax near the eardrum we will treat with antibiotics to cover for the possibility of ear infection probably has underlying viral sinus issue no sign of pneumonia      Assessment & Plan:  Left otitis media Antibiotic prescribed Ears irrigated but has residual wax Referral to ENT For cerumen impaction and removal

## 2023-09-24 ENCOUNTER — Other Ambulatory Visit: Payer: Self-pay

## 2023-09-24 DIAGNOSIS — H6123 Impacted cerumen, bilateral: Secondary | ICD-10-CM

## 2023-10-19 ENCOUNTER — Ambulatory Visit: Admitting: Physician Assistant

## 2023-10-19 ENCOUNTER — Encounter: Payer: Self-pay | Admitting: Physician Assistant

## 2023-10-19 VITALS — BP 123/82 | HR 105 | Temp 98.8°F | Ht 70.0 in | Wt 228.0 lb

## 2023-10-19 DIAGNOSIS — R21 Rash and other nonspecific skin eruption: Secondary | ICD-10-CM | POA: Diagnosis not present

## 2023-10-19 DIAGNOSIS — L720 Epidermal cyst: Secondary | ICD-10-CM | POA: Diagnosis not present

## 2023-10-19 MED ORDER — TRIAMCINOLONE ACETONIDE 0.5 % EX CREA
1.0000 | TOPICAL_CREAM | Freq: Three times a day (TID) | CUTANEOUS | 0 refills | Status: AC
Start: 2023-10-19 — End: ?

## 2023-10-19 NOTE — Assessment & Plan Note (Signed)
 Patient presents today with a 2-3 mm nodule on the occipital region of his scalp. Nodule is firm, immobile, and non tender. No erythema. Patient has not experienced fevers There is no evidence of acute infection, rapid growth, or neurologic involvement. No features suggest malignancy or intracranial extension. Reassurance given. Discussed watchful waiting. Patient to follow up for new fevers, pain, headaches, nausea, vomiting, visual changes, neck stiffness, or other acute neuro signs. Patient voices understanding.

## 2023-10-19 NOTE — Progress Notes (Signed)
 Acute Office Visit  Subjective:     Patient ID: Duane Fleming, male    DOB: Aug 26, 2000, 23 y.o.   MRN: [id number]   Patient presents today with concerns for a new rash on bilateral elbows and concerns for a nodule on the back of his head. He states rash has been present for approximately 2 weeks. States rash is very pruritic. Denies fevers or new soaps or lotions. States OTC hydrocortisone  cream has been moderately beneficial for pruritus. Additionally, patient voices concern for a nodule on the back of his head. States nodule has been present for 1 month. Has not changes in size. Does not cause pain. No new headaches, nausea, or vomiting. No drainage or discharge from nodule.      Review of Systems  Constitutional:  Negative for chills, fever and malaise/fatigue.  Gastrointestinal:  Negative for nausea and vomiting.  Musculoskeletal:  Negative for myalgias.  Skin:  Positive for itching and rash.  Neurological:  Negative for seizures, loss of consciousness, weakness and headaches.        Objective:     BP 123/82   Pulse (!) 105   Temp 98.8 F (37.1 C)   Ht 5' 10 (1.778 m)   Wt 228 lb (103.4 kg)   SpO2 99%   BMI 32.71 kg/m   Physical Exam Constitutional:      General: He is not in acute distress.    Appearance: He is well-developed. He is obese.  HENT:     Head: Normocephalic and atraumatic.      Nose: Nose normal.     Mouth/Throat:     Mouth: Mucous membranes are moist.     Pharynx: Oropharynx is clear.  Eyes:     Extraocular Movements: Extraocular movements intact.     Conjunctiva/sclera: Conjunctivae normal.  Cardiovascular:     Rate and Rhythm: Normal rate and regular rhythm.     Heart sounds: Normal heart sounds. No murmur heard. Pulmonary:     Effort: Pulmonary effort is normal.     Breath sounds: Normal breath sounds.  Skin:    Findings: Rash present. Rash is papular.  Neurological:     General: No focal deficit present.     Mental Status: He is  alert and oriented to person, place, and time.     Cranial Nerves: No cranial nerve deficit.     Sensory: No sensory deficit.     Motor: No weakness.     Gait: Gait normal.  Psychiatric:        Mood and Affect: Mood normal.     No results found for any visits on 10/19/23.      Assessment & Plan:  Rash Assessment & Plan: Patient presents today with pruritic, papular rash on bilateral elbows. No signs of acute infection, minimal erythema. No excoriations. No discharge. No fevers. Likely atopic vs contact dermatitis. Will trial triamcinolone  for symptoms. Patient to follow up if symptoms do not improve or he experiences new symptoms such as fever.   Orders: -     Triamcinolone  Acetonide; Apply 1 Application topically 3 (three) times daily.  Dispense: 30 g; Refill: 0  Epidermoid cyst of skin of scalp Assessment & Plan: Patient presents today with a 2-3 mm nodule on the occipital region of his scalp. Nodule is firm, immobile, and non tender. No erythema. Patient has not experienced fevers There is no evidence of acute infection, rapid growth, or neurologic involvement. No features suggest malignancy or intracranial extension. Reassurance given.  Discussed watchful waiting. Patient to follow up for new fevers, pain, headaches, nausea, vomiting, visual changes, neck stiffness, or other acute neuro signs. Patient voices understanding.     No follow-ups on file.  Charmaine Chasmine Lender, PA-C

## 2023-10-19 NOTE — Assessment & Plan Note (Signed)
 Patient presents today with pruritic, papular rash on bilateral elbows. No signs of acute infection, minimal erythema. No excoriations. No discharge. No fevers. Likely atopic vs contact dermatitis. Will trial triamcinolone  for symptoms. Patient to follow up if symptoms do not improve or he experiences new symptoms such as fever.

## 2023-10-24 ENCOUNTER — Ambulatory Visit: Admitting: Family Medicine

## 2023-10-24 ENCOUNTER — Ambulatory Visit: Payer: Self-pay

## 2023-10-24 VITALS — BP 122/86 | HR 121 | Ht 70.0 in | Wt 226.4 lb

## 2023-10-24 DIAGNOSIS — L720 Epidermal cyst: Secondary | ICD-10-CM | POA: Diagnosis not present

## 2023-10-24 DIAGNOSIS — M7522 Bicipital tendinitis, left shoulder: Secondary | ICD-10-CM | POA: Diagnosis not present

## 2023-10-24 NOTE — Telephone Encounter (Signed)
 FYI Only or Action Required?: FYI only for provider.  Patient was last seen in primary care on 09/21/2023 by Alphonsa Glendia LABOR, MD.  Called Nurse Triage reporting Shoulder Pain.  Symptoms began several weeks ago.  Interventions attempted: Nothing.  Symptoms are: gradually worsening.  Triage Disposition: See HCP Within 4 Hours (Or PCP Triage)  Patient/caregiver understands and will follow disposition?: Yes    Copied from CRM [id number]. Topic: Clinical - Red Word Triage >> Oct 24, 2023  9:01 AM Antwanette L wrote: Red Word that prompted transfer to Nurse Triage: patient is experiencing severe back and left shoulder pain Reason for Disposition  [1] Shoulder pains with exertion (e.g., walking) AND [2] pain goes away on resting AND [3] not present now  Answer Assessment - Initial Assessment Questions 1. ONSET: When did the pain start?     Couple weeks ago 2. LOCATION: Where is the pain located?     Left shoulder 3. PAIN: How bad is the pain? (Scale 1-10; or mild, moderate, severe)     Moderate to severe 4. WORK OR EXERCISE: Has there been any recent work or exercise that involved this part of the body?     Weight lifting 5. CAUSE: What do you think is causing the shoulder pain?     unknown 6. OTHER SYMPTOMS: Do you have any other symptoms? (e.g., neck pain, swelling, rash, fever, numbness, weakness)     Numbness in left lower arm 7. PREGNANCY: Is there any chance you are pregnant? When was your last menstrual period?     na  Protocols used: Shoulder Pain-A-AH

## 2023-10-24 NOTE — Telephone Encounter (Signed)
 Notes has appt today

## 2023-10-24 NOTE — Progress Notes (Signed)
   Subjective:    Patient ID: Duane Fleming, male    DOB: 2000-09-22, 22 y.o.   MRN: [id number]  HPI Pt comes in today for pain in the L bicep area of arm w swelling, pt is unable to flex muscle, pt is able to move arm with slight pain. Pt states that the pain has been going on for about a month but OTC medication has helped until the other da when the pain became unbearable. Pt thinks injury occurred while working out. Medications and allergies reviewed. He relates a burning pain in his left bicep has been doing a lot of heavy weightlifting recently Recently jumped into a lot of weight training Trying to eat healthy Trying to keep his weight check Patient also relates at times it feels like his shoulder might feel like it pops in and out Denies any numbness or tingling in the arms No known injury  He also was recently seen for a couple of bumps on his head he was concerned about  Review of Systems     Objective:   Physical Exam  General-in no acute distress Eyes-no discharge Lungs-respiratory rate normal, CTA CV-no murmurs,RRR Extremities skin warm dry no edema Neuro grossly normal Behavior normal, alert No rotary cuff instability no tear no tendinitis Bicep tendinitis noted  Very small cyst on the scalp Prominent occiput noted      Assessment & Plan:   Prominent occiput-reassurance given Very small cyst in the top of his scalp I would not recommend any surgery for this if it gets worse he will let us  know  Biceps tendinitis Cool compresses frequently Stretching exercises shown printout given Ibuprofen  200 mg 2 tablets 3 times daily over the next 7 days If any ongoing troubles or problems to let us  know Hold off on any physical therapy x-rays currently When he resumes exercise to do so gradually

## 2023-10-25 ENCOUNTER — Encounter: Payer: Self-pay | Admitting: Family Medicine

## 2023-10-26 ENCOUNTER — Ambulatory Visit (INDEPENDENT_AMBULATORY_CARE_PROVIDER_SITE_OTHER): Admitting: Physician Assistant

## 2023-10-26 ENCOUNTER — Encounter (INDEPENDENT_AMBULATORY_CARE_PROVIDER_SITE_OTHER): Payer: Self-pay | Admitting: Physician Assistant

## 2023-10-26 VITALS — BP 154/95 | Ht 70.0 in | Wt 225.0 lb

## 2023-10-26 DIAGNOSIS — H6123 Impacted cerumen, bilateral: Secondary | ICD-10-CM

## 2023-10-26 NOTE — Patient Instructions (Signed)
 Please place 5 to 10 drops of carbamide peroxide (Debrox) into the affected ear. Keep the drops in your ear for several minutes by keeping your head tilted or placing a cotton ball in your ear. If needed, continue to use carbamide peroxide (Debrox) twice daily for up to 4 days.

## 2023-10-26 NOTE — Progress Notes (Signed)
 Dear Dr. Alphonsa, Here is my assessment for our mutual patient, Duane Fleming. Thank you for allowing me the opportunity to care for your patient. Please do not hesitate to contact me should you have any other questions. Sincerely, Chyrl Cohen PA-C  Otolaryngology Clinic Note Referring provider: Dr. Alphonsa HPI:  Duane Fleming is a 23 y.o. male kindly referred by Dr. Alphonsa   The patient is a 23 year old male seen in our office for evaluation of cerumen impaction.  The patient notes that approxi-6 months ago he started developing pain and difficulty hearing in the left ear.  He reports following up with his primary care provider who attempted irrigation, he notes that this made the pain and hearing worse.  He notes he has had previous history of the same and has had cerumen that was removed which resolved the symptoms.  He denies any trauma to his ear, no infection, no drainage.  No history of surgeries to the ear.  Chart review shows he was previously seen approximately 1 year ago by Dr. Soldatova.  At that time he reported ear pressure, dizziness and episodes of syncope.    Independent Review of Additional Tests or Records:  Office visit note on 10/27/2022   PMH/Meds/All/SocHx/FamHx/ROS:   Past Medical History:  Diagnosis Date   Abrasion of leg 10/23/2016   Anxiety    Asthma    Chronic headache    Depression    Difficulty controlling anger    Family history of adverse reaction to anesthesia    maternal grandmother has issues with anxiety when waking from anesthesia   Tonsillar and adenoid hypertrophy 10/2016   mother unsure of apnea/snoring during sleep     Past Surgical History:  Procedure Laterality Date   ESOPHAGOGASTRODUODENOSCOPY     FRENULECTOMY, LINGUAL     TONSILLECTOMY AND ADENOIDECTOMY Bilateral 10/30/2016   Procedure: TONSILLECTOMY AND ADENOIDECTOMY;  Surgeon: Karis Clunes, MD;  Location: [facility];  Service: ENT;  Laterality: Bilateral;    Family  History  Problem Relation Age of Onset   Asthma Sister    Hypertension Maternal Grandmother    Anesthesia problems Maternal Grandmother        anxiety when waking from anesthesia   Hypertension Maternal Grandfather    Esophageal cancer Neg Hx    Stomach cancer Neg Hx      Social Connections: Not on file      Current Outpatient Medications:    albuterol  (VENTOLIN  HFA) 108 (90 Base) MCG/ACT inhaler, Inhale 2 puffs into the lungs every 6 (six) hours as needed for wheezing or shortness of breath., Disp: 18 g, Rfl: 1   cloNIDine (CATAPRES) 0.2 MG tablet, Take 0.2 mg by mouth 2 (two) times daily as needed., Disp: , Rfl:    FLUoxetine  (PROZAC ) 40 MG capsule, Take 40 mg by mouth every morning., Disp: , Rfl:    OLANZapine (ZYPREXA) 5 MG tablet, 5 mg daily., Disp: , Rfl:    promethazine  (PHENERGAN ) 25 MG tablet, 1/2 to 1 q 6 hours prn nausea, Disp: 25 tablet, Rfl: 2   triamcinolone  cream (KENALOG ) 0.5 %, Apply 1 Application topically 3 (three) times daily., Disp: 30 g, Rfl: 0   Physical Exam:   BP (!) 154/95   Ht 5' 10 (1.778 m)   Wt 225 lb (102.1 kg)   SpO2 98%   BMI 32.28 kg/m   Pertinent Findings  CN II-XII intact Bilateral cerumen impaction Weber 512: equal Rinne 512: AC > BC b/l  Anterior rhinoscopy: Septum ;  bilateral inferior turbinates with mild hypertrophy No lesions of oral cavity/oropharynx; dentition within normal limits No obviously palpable neck masses/lymphadenopathy/thyromegaly No respiratory distress or stridor  Seprately Identifiable Procedures:  Procedure: Bilateral ear microscopy and cerumen removal using microscope (CPT [phone]) - Mod 50 Pre-procedure diagnosis: bilateral cerumen impaction external auditory canals Post-procedure diagnosis: same Indication: bilateral cerumen impaction; given patient's otologic complaints and history as well as for improved and comprehensive examination of external ear and tympanic membrane, bilateral otologic examination using  microscope was performed and impacted cerumen removed  Procedure: Patient was placed semi-recumbent. Both ear canals were examined using the microscope with findings above. Cerumen removed from bilateral external auditory canals using suction and currette with improvement in EAC examination and patency. Left: EAC was patent. TM was intact . Middle ear was aerated. Drainage: none Right: EAC was patent. TM was intact . Middle ear was aerated . Drainage: none Patient tolerated the procedure well.   Impression & Plans:  Duane Fleming is a 23 y.o. male with the following   Cerumen impaction-  Bilateral cerumen impaction removed without difficulty.  Patient notes still feeling some discomfort in the left ear.  No signs of infection or trauma.  I recommend the patient monitor her symptoms over the next several weeks, if he has persistent pain, any decreased hearing, or any concerns that he follow-up in our office with audiological evaluation.  Am happy to see him again at any point in the future.   - f/u audiological evaluation if symptoms persist, otherwise PRN   Thank you for allowing me the opportunity to care for your patient. Please do not hesitate to contact me should you have any other questions.  Sincerely, Chyrl Cohen PA-C [facility] Phone: [phone] Fax: [phone]  10/26/2023, 8:31 AM
